# Patient Record
Sex: Male | Born: 1960
Health system: Southern US, Community
[De-identification: ages and names within clinical notes are randomized; demographics above are authoritative.]

## PROBLEM LIST (undated history)

## (undated) HISTORY — PX: CARPAL TUNNEL RELEASE: SHX101

---

## 2005-04-15 ENCOUNTER — Other Ambulatory Visit: Payer: Self-pay

## 2005-04-29 ENCOUNTER — Ambulatory Visit: Payer: Self-pay | Admitting: Orthopaedic Surgery

## 2005-05-31 ENCOUNTER — Emergency Department: Payer: Self-pay | Admitting: Emergency Medicine

## 2006-03-10 ENCOUNTER — Ambulatory Visit: Payer: Self-pay | Admitting: Orthopaedic Surgery

## 2006-12-26 ENCOUNTER — Ambulatory Visit: Payer: Self-pay | Admitting: Internal Medicine

## 2007-08-13 ENCOUNTER — Ambulatory Visit: Payer: Self-pay | Admitting: Internal Medicine

## 2012-04-16 ENCOUNTER — Ambulatory Visit: Payer: Self-pay | Admitting: General Practice

## 2014-11-28 NOTE — Op Note (Signed)
PATIENT NAME:  Edward Frederick, Edward Frederick MR#:  [id number] DATE OF BIRTH:  07/02/1961  DATE OF PROCEDURE:  04/16/2012  PREOPERATIVE DIAGNOSES:  1. Left carpal tunnel syndrome.  2. Left stenosing tenosynovitis of the thumb.   POSTOPERATIVE DIAGNOSES: 1. Left carpal tunnel syndrome.  2. Left stenosing tenosynovitis of the thumb.      PROCEDURES PERFORMED:  1. Left carpal tunnel release. 2. Left trigger thumb release.   SURGEON: Laurice Record. Hooten, MD.   ANESTHESIA: General.   ESTIMATED BLOOD LOSS: Minimal.  TOURNIQUET TIME: 39 minutes.  DRAINS: None.   INDICATIONS FOR SURGERY: The patient is a 54 year old male who has been seen for complaints of numbness and paresthesias to the left hand as well as triggering and pain in the area of the A1 pulley of the left thumb. After a discussion of the risks and benefits of surgical intervention, the patient expressed his understanding of the risks and benefits and agreed with plans for surgical intervention.   PROCEDURE IN DETAIL: The patient was brought to the Operating Room and, after adequate general anesthesia was achieved, a tourniquet was placed on the patient's upper left arm. The patient's left hand and arm were cleaned and prepped with alcohol and DuraPrep and draped in the usual sterile fashion. A "time-out" was performed as per usual protocol. The left upper extremity was exsanguinated using an Esmarch, and the tourniquet was inflated to 250 mmHg. Loupe magnification was used throughout the procedure. A curvilinear incision was made just ulnar to the thenar palmar crease. Dissection was carried down through the palmar fascia to the transverse carpal ligament. The transverse carpal ligament was sharply incised, taking care to protect the underlying structures within the carpal tunnel. Complete release of the transverse carpal ligament was achieved. Initial evaluation of the median nerve showed it to be flattened under the extent of the transverse carpal  ligament. However, there was restoration of more normal shape with visible vascularity noted along its surface. The wound was irrigated with copious amounts of normal saline with antibiotic solution. Skin edges were reapproximated using interrupted sutures of 5-0 nylon.   Next, a transverse incision was made at the base of the thumb at the proximal extent of the A1 pulley. Careful dissection was carried down to the tendon sheath and the leading edge of the A1 pulley of the left thumb was visualized and sharply incised using tenotomy scissors. Complete release of the A1 pulley was appreciated. Passive range of motion of the thumb demonstrated smooth excursion of the tendon. The wound was irrigated with normal saline with antibiotic solution. The skin edges were reapproximated using 5-0 nylon. 0.25% Marcaine was used to inject along the incision sites. A sterile dressing was applied followed by application of a volar splint. Tourniquet was deflated after a total tourniquet time of 39 minutes. The patient tolerated the procedure well. He was transported to the recovery room in stable condition.    ____________________________ Laurice Record. Holley Bouche., MD jph:ap D: 04/16/2012 12:54:44 ET T: 04/16/2012 17:26:58 ET JOB#: [id number]  cc: Laurice Record. Holley Bouche., MD, <Dictator> JAMES P Holley Bouche MD ELECTRONICALLY SIGNED 04/18/2012 13:13

## 2015-08-23 ENCOUNTER — Ambulatory Visit: Payer: Self-pay | Admitting: Physician Assistant

## 2015-08-23 ENCOUNTER — Encounter: Payer: Self-pay | Admitting: Physician Assistant

## 2015-08-23 VITALS — BP 158/90 | HR 92 | Temp 97.9°F

## 2015-08-23 DIAGNOSIS — J069 Acute upper respiratory infection, unspecified: Secondary | ICD-10-CM

## 2015-08-23 MED ORDER — AZITHROMYCIN 250 MG PO TABS: ORAL_TABLET | ORAL | Status: DC

## 2015-08-23 MED ORDER — FLUTICASONE PROPIONATE 50 MCG/ACT NA SUSP: 2.0000 | Freq: Every day | NASAL | Status: DC

## 2015-08-23 NOTE — Progress Notes (Signed)
S: C/o runny nose and congestion for 3 days, no fever, chills, cp/sob, v/d; mucus is clear/yellow and thick, cough is sporadic, c/o of facial and dental pain.   Using otc meds: mucinex d  O: PE: vitals w slightly elevated bp and pulse (pt states he ran over here) nad,  perrl eomi, normocephalic, tms dull, nasal mucosa red and swollen, throat injected, neck supple no lymph, lungs c t a, cv rrr, neuro intact  A:  Acute sinusitis   P: zpack, flonase, monitor pulse and bp, if increasing on mucinex d then stop the medication;  drink fluids, continue regular meds , use otc meds of choice, return if not improving in 5 days, return earlier if worsening

## 2015-09-05 DIAGNOSIS — Z0000 Encounter for general adult medical examination without abnormal findings: Secondary | ICD-10-CM | POA: Diagnosis not present

## 2015-09-05 DIAGNOSIS — N400 Enlarged prostate without lower urinary tract symptoms: Secondary | ICD-10-CM | POA: Diagnosis not present

## 2015-09-05 DIAGNOSIS — Z125 Encounter for screening for malignant neoplasm of prostate: Secondary | ICD-10-CM | POA: Diagnosis not present

## 2015-09-05 DIAGNOSIS — E291 Testicular hypofunction: Secondary | ICD-10-CM | POA: Diagnosis not present

## 2015-09-05 DIAGNOSIS — R030 Elevated blood-pressure reading, without diagnosis of hypertension: Secondary | ICD-10-CM | POA: Diagnosis not present

## 2016-02-25 ENCOUNTER — Ambulatory Visit: Payer: Self-pay | Admitting: Physician Assistant

## 2016-02-25 ENCOUNTER — Encounter: Payer: Self-pay | Admitting: Physician Assistant

## 2016-02-25 VITALS — BP 138/90 | HR 80 | Temp 97.6°F

## 2016-02-25 DIAGNOSIS — J069 Acute upper respiratory infection, unspecified: Secondary | ICD-10-CM

## 2016-02-25 MED ORDER — AZITHROMYCIN 250 MG PO TABS: ORAL_TABLET | ORAL | Status: DC

## 2016-02-25 NOTE — Progress Notes (Signed)
S:  Facial pressure, dry cough and yellow mucus for 3 weeks.  Not aware of fever.  OTC robitussin not helping.  Non- smoker O: TMS dull bilat, nose boggy, throat with mod injection and drainage, neck supple without aden.  Lungs clear with congested cough.  Heart RRR. A: sinusitis/ acute bronchitis P: z pack    If not improving in one week then CXR

## 2016-08-31 ENCOUNTER — Ambulatory Visit
Admission: EM | Admit: 2016-08-31 | Discharge: 2016-08-31 | Disposition: A | Discharge status: Home / Self Care | Payer: 59 | Attending: Family Medicine | Admitting: Family Medicine

## 2016-08-31 DIAGNOSIS — J069 Acute upper respiratory infection, unspecified: Secondary | ICD-10-CM

## 2016-08-31 MED ORDER — AMOXICILLIN-POT CLAVULANATE 875-125 MG PO TABS: 1.0000 | ORAL_TABLET | Freq: Two times a day (BID) | ORAL | 0 refills | Status: DC

## 2016-08-31 NOTE — ED Provider Notes (Signed)
[facility]    CSN: [id number] Arrival date & time: 08/31/16  0800  History   Chief Complaint Chief Complaint  Patient presents with  . Sinus Problem   HPI  56 year old male with hypogonadism, and BPH presents with complaints of sinus pain and pressure.  Patient states that he's been sick since Friday. He's had severe sinus pain and pressure. Ear pain as well. Mild cough. No fever or chills. Symptoms are severe per his report. He's been using Mucinex D without improvement. No known exacerbating factors. His primary concern is transmitting his illness to his wife as she is undergoing chemotherapy. No other associated symptoms. No other complaints or concerns at this time.  PMH - BPH, Low T  Past Surgical History:  Procedure Laterality Date  . CARPAL TUNNEL RELEASE      Home Medications    Prior to Admission medications   Medication Sig Start Date End Date Taking? Authorizing Provider  testosterone (ANDROGEL) 50 MG/5GM (1%) GEL Place 5 g onto the skin daily.   Yes Historical Provider, MD  amoxicillin-clavulanate (AUGMENTIN) 875-125 MG tablet Take 1 tablet by mouth every 12 (twelve) hours. 08/31/16   Coral Spikes, DO  azithromycin (ZITHROMAX Z-PAK) 250 MG tablet 2 pills today then 1 pill a day for 4 days 02/25/16   Johnn Hai, PA-C  fluticasone [facility]) 50 MCG/ACT nasal spray Place 2 sprays into both nostrils daily. 08/23/15   Versie Starks, PA-C    Family History History reviewed. No pertinent family history. (related to his acute compliant)  Social History Social History  Substance Use Topics  . Smoking status: Never Smoker  . Smokeless tobacco: Never Used  . Alcohol use No   Allergies   Patient has no known allergies.  Review of Systems Review of Systems  Constitutional: Negative for fever.  HENT: Positive for ear pain, postnasal drip, sinus pain and sinus pressure.   Respiratory: Positive for cough.   All other systems reviewed and are  negative.  Physical Exam Triage Vital Signs ED Triage Vitals  Enc Vitals Group     BP 08/31/16 0817 130/81     Pulse Rate 08/31/16 0817 74     Resp 08/31/16 0817 16     Temp 08/31/16 0817 97.8 F (36.6 C)     Temp Source 08/31/16 0817 Oral     SpO2 08/31/16 0817 100 %     Weight 08/31/16 0814 220 lb (99.8 kg)     Height 08/31/16 0814 5\' 8"  (1.727 m)     Head Circumference --      Peak Flow --      Pain Score 08/31/16 0815 6     Pain Loc --      Pain Edu? --      Excl. in [place]? --    Updated Vital Signs BP 130/81 (BP Location: Left Arm)   Pulse 74   Temp 97.8 F (36.6 C) (Oral)   Resp 16   Ht 5\' 8"  (1.727 m)   Wt 220 lb (99.8 kg)   SpO2 100%   BMI 33.45 kg/m   Physical Exam  Constitutional: He is oriented to person, place, and time. He appears well-developed. No distress.  HENT:  Head: Normocephalic and atraumatic.  Left TM obscured by cerumen. Right TM partially obscured by cerumen. Portion that was observed on the right appeared normal. Mild maxillary sinus tenderness to palpation.  Eyes: Conjunctivae are normal. Pupils are equal, round, and reactive to light.  Neck: Neck supple.  Cardiovascular: Normal rate and regular rhythm.   Pulmonary/Chest: Effort normal and breath sounds normal.  Abdominal: Soft. He exhibits no distension. There is no tenderness.  Musculoskeletal: Normal range of motion.  Neurological: He is alert and oriented to person, place, and time.  Skin: No rash noted.  Psychiatric: He has a normal mood and affect.  Vitals reviewed.  UC Treatments / Results  Labs (all labs ordered are listed, but only abnormal results are displayed) Labs Reviewed - No data to display  EKG  EKG Interpretation None      Radiology No results found.  Procedures Procedures (including critical care time)  Medications Ordered in UC Medications - No data to display   Initial Impression / Assessment and Plan / UC Course  I have reviewed the triage vital  signs and the nursing notes.  Pertinent labs & imaging results that were available during my care of the patient were reviewed by me and considered in my medical decision making (see chart for details).  57 year old male presents with upper respiratory complaints. Likely viral in nature. He is concerned about sinusitis and transmission to his wife. Will encourage over-the-counter medications - antihistamine, Flonase. If he fails to improve he can fill the antibiotic that is given (to cover for sinusitis).   Final Clinical Impressions(s) / UC Diagnoses   Final diagnoses:  Upper respiratory tract infection, unspecified type    New Prescriptions New Prescriptions   AMOXICILLIN-CLAVULANATE (AUGMENTIN) 875-125 MG TABLET    Take 1 tablet by mouth every 12 (twelve) hours.     Coral Spikes, DO 08/31/16 [phone]

## 2016-08-31 NOTE — Discharge Instructions (Signed)
This is likely viral.  Flonase and an OTC antihistamine. Rest, fluids.  If you fail to improve, you can fill the antibiotic.  Take care  Dr. Lacinda Axon

## 2016-08-31 NOTE — ED Triage Notes (Signed)
Patient complains of sinus pain and pressure. Patient states that this morning when he woke up he had an left ear ache. Patient reports that initial onset of symptoms started 4 days ago and have been worsening. Patient states that he has failed multiple OTC medications.

## 2016-09-03 DIAGNOSIS — H6123 Impacted cerumen, bilateral: Secondary | ICD-10-CM | POA: Diagnosis not present

## 2016-09-03 DIAGNOSIS — H60332 Swimmer's ear, left ear: Secondary | ICD-10-CM | POA: Diagnosis not present

## 2016-09-05 DIAGNOSIS — Z125 Encounter for screening for malignant neoplasm of prostate: Secondary | ICD-10-CM | POA: Diagnosis not present

## 2016-09-05 DIAGNOSIS — Z0000 Encounter for general adult medical examination without abnormal findings: Secondary | ICD-10-CM | POA: Diagnosis not present

## 2016-09-05 DIAGNOSIS — E291 Testicular hypofunction: Secondary | ICD-10-CM | POA: Diagnosis not present

## 2016-09-05 DIAGNOSIS — N400 Enlarged prostate without lower urinary tract symptoms: Secondary | ICD-10-CM | POA: Diagnosis not present

## 2016-09-23 ENCOUNTER — Ambulatory Visit: Payer: Self-pay | Admitting: Physician Assistant

## 2016-09-23 ENCOUNTER — Encounter: Payer: Self-pay | Admitting: Physician Assistant

## 2016-09-23 VITALS — BP 130/84 | HR 92 | Temp 98.0°F

## 2016-09-23 DIAGNOSIS — J069 Acute upper respiratory infection, unspecified: Secondary | ICD-10-CM

## 2016-09-23 LAB — POCT INFLUENZA A/B
Influenza A, POC: NEGATIVE
Influenza B, POC: NEGATIVE

## 2016-09-23 MED ORDER — HYDROCOD POLST-CPM POLST ER 10-8 MG/5ML PO SUER: 5.0000 mL | Freq: Two times a day (BID) | ORAL | 0 refills | Status: DC | PRN

## 2016-09-23 MED ORDER — LEVOFLOXACIN 500 MG PO TABS: 500.0000 mg | ORAL_TABLET | Freq: Every day | ORAL | 0 refills | Status: DC

## 2016-09-23 NOTE — Progress Notes (Signed)
S: C/o runny nose and congestion with dry cough for 14 days, + fever, chills, this weekend; some body aches; was on augmentin starting on 08-31-16, felt a little better but never felt like he got well;  denies cp/sob, v/d; mucus was green this am but clear throughout the day, cough is sporadic, wife has same sx, been passing it back and forth Using otc meds: r  O: PE: vitals wnl, nad,  perrl eomi, normocephalic, tms dull, nasal mucosa red and swollen, throat injected, neck supple no lymph, lungs c t a, cv rrr, neuro intact, cough is deep and hacking, flu swab neg  A:  Acute uri   P: levaquin, tussionex 181ml nr, 56ml q12h prn cough; drink fluids, continue regular meds , use otc meds of choice, return if not improving in 5 days, return earlier if worsening , rx for wife [employer] for tamiflu preventive,

## 2017-06-04 DIAGNOSIS — C4492 Squamous cell carcinoma of skin, unspecified: Secondary | ICD-10-CM

## 2017-06-04 DIAGNOSIS — L820 Inflamed seborrheic keratosis: Secondary | ICD-10-CM | POA: Diagnosis not present

## 2017-06-04 DIAGNOSIS — D2230 Melanocytic nevi of unspecified part of face: Secondary | ICD-10-CM | POA: Diagnosis not present

## 2017-06-04 DIAGNOSIS — D485 Neoplasm of uncertain behavior of skin: Secondary | ICD-10-CM | POA: Diagnosis not present

## 2017-06-04 DIAGNOSIS — D225 Melanocytic nevi of trunk: Secondary | ICD-10-CM | POA: Diagnosis not present

## 2017-06-04 DIAGNOSIS — L812 Freckles: Secondary | ICD-10-CM | POA: Diagnosis not present

## 2017-06-04 DIAGNOSIS — D0439 Carcinoma in situ of skin of other parts of face: Secondary | ICD-10-CM | POA: Diagnosis not present

## 2017-06-04 DIAGNOSIS — D229 Melanocytic nevi, unspecified: Secondary | ICD-10-CM | POA: Diagnosis not present

## 2017-06-04 DIAGNOSIS — L570 Actinic keratosis: Secondary | ICD-10-CM | POA: Diagnosis not present

## 2017-06-04 DIAGNOSIS — L219 Seborrheic dermatitis, unspecified: Secondary | ICD-10-CM | POA: Diagnosis not present

## 2017-06-04 DIAGNOSIS — L821 Other seborrheic keratosis: Secondary | ICD-10-CM | POA: Diagnosis not present

## 2017-06-04 HISTORY — DX: Squamous cell carcinoma of skin, unspecified: C44.92

## 2017-07-01 DIAGNOSIS — L578 Other skin changes due to chronic exposure to nonionizing radiation: Secondary | ICD-10-CM | POA: Diagnosis not present

## 2017-07-01 DIAGNOSIS — L988 Other specified disorders of the skin and subcutaneous tissue: Secondary | ICD-10-CM | POA: Diagnosis not present

## 2017-07-01 DIAGNOSIS — L814 Other melanin hyperpigmentation: Secondary | ICD-10-CM | POA: Diagnosis not present

## 2017-07-01 DIAGNOSIS — D0439 Carcinoma in situ of skin of other parts of face: Secondary | ICD-10-CM | POA: Diagnosis not present

## 2017-08-13 DIAGNOSIS — L570 Actinic keratosis: Secondary | ICD-10-CM | POA: Diagnosis not present

## 2017-08-13 DIAGNOSIS — Z85828 Personal history of other malignant neoplasm of skin: Secondary | ICD-10-CM | POA: Diagnosis not present

## 2017-08-13 DIAGNOSIS — L110 Acquired keratosis follicularis: Secondary | ICD-10-CM | POA: Diagnosis not present

## 2017-08-13 DIAGNOSIS — D485 Neoplasm of uncertain behavior of skin: Secondary | ICD-10-CM | POA: Diagnosis not present

## 2017-08-24 ENCOUNTER — Ambulatory Visit (INDEPENDENT_AMBULATORY_CARE_PROVIDER_SITE_OTHER): Payer: Self-pay | Admitting: Emergency Medicine

## 2017-08-24 VITALS — BP 141/90 | HR 95 | Temp 98.2°F | Resp 16 | Wt 220.0 lb

## 2017-08-24 DIAGNOSIS — J019 Acute sinusitis, unspecified: Secondary | ICD-10-CM

## 2017-08-24 MED ORDER — AMOXICILLIN-POT CLAVULANATE 875-125 MG PO TABS: 1.0000 | ORAL_TABLET | Freq: Two times a day (BID) | ORAL | 0 refills | Status: DC

## 2017-08-24 MED ORDER — PREDNISONE 20 MG PO TABS
20.0000 mg | ORAL_TABLET | Freq: Every day | ORAL | 0 refills | Status: DC
Start: 2017-08-24 — End: 2019-10-31

## 2017-08-24 NOTE — Progress Notes (Signed)
Subjective:     SAM OVERBECK is a 57 y.o. male who presents for evaluation of sinus pain. Symptoms include: congestion, facial pain, fevers, foul rhinorrhea, nasal congestion, purulent rhinorrhea, sinus pressure and tooth pain. Onset of symptoms was 3 days ago. Symptoms have been unchanged since that time. Past history is significant for no history of pneumonia or bronchitis. Patient is a non-smoker.  The following portions of the patient's history were reviewed and updated as appropriate: allergies and current medications.  Review of Systems Pertinent items noted in HPI and remainder of comprehensive ROS otherwise negative.   Objective:    BP (!) 141/90   Pulse 95   Temp 98.2 F (36.8 C)   Resp 16   Wt 220 lb (99.8 kg)   SpO2 97%   BMI 33.45 kg/m  General appearance: alert, cooperative and appears stated age Head: Normocephalic, without obvious abnormality, atraumatic Eyes: negative Ears: normal TM's and external ear canals both ears Nose: green and mucoid discharge, sinus tenderness bilateral Throat: lips, mucosa, and tongue normal; teeth and gums normal Neck: no adenopathy Lungs: clear to auscultation bilaterally Heart: regular rate and rhythm Extremities: extremities normal, atraumatic, no cyanosis or edema Pulses: 2+ and symmetric    Assessment:    Acute bacterial sinusitis.    Plan:    Nasal saline sprays. Augmentin per medication orders. Follow up in 1 week or as needed. Rest, fluids, counseling provided on OTC medicines for symptom managament.

## 2017-08-24 NOTE — Patient Instructions (Signed)

## 2017-08-28 DIAGNOSIS — L570 Actinic keratosis: Secondary | ICD-10-CM | POA: Diagnosis not present

## 2017-10-09 DIAGNOSIS — Z0000 Encounter for general adult medical examination without abnormal findings: Secondary | ICD-10-CM | POA: Diagnosis not present

## 2017-10-09 DIAGNOSIS — I10 Essential (primary) hypertension: Secondary | ICD-10-CM | POA: Diagnosis not present

## 2017-10-09 DIAGNOSIS — E291 Testicular hypofunction: Secondary | ICD-10-CM | POA: Diagnosis not present

## 2017-10-09 DIAGNOSIS — Z125 Encounter for screening for malignant neoplasm of prostate: Secondary | ICD-10-CM | POA: Diagnosis not present

## 2017-12-31 DIAGNOSIS — Z85828 Personal history of other malignant neoplasm of skin: Secondary | ICD-10-CM | POA: Diagnosis not present

## 2017-12-31 DIAGNOSIS — D229 Melanocytic nevi, unspecified: Secondary | ICD-10-CM | POA: Diagnosis not present

## 2017-12-31 DIAGNOSIS — Z1283 Encounter for screening for malignant neoplasm of skin: Secondary | ICD-10-CM | POA: Diagnosis not present

## 2017-12-31 DIAGNOSIS — D1800 Hemangioma unspecified site: Secondary | ICD-10-CM | POA: Diagnosis not present

## 2017-12-31 DIAGNOSIS — B353 Tinea pedis: Secondary | ICD-10-CM | POA: Diagnosis not present

## 2017-12-31 DIAGNOSIS — L814 Other melanin hyperpigmentation: Secondary | ICD-10-CM | POA: Diagnosis not present

## 2017-12-31 DIAGNOSIS — L820 Inflamed seborrheic keratosis: Secondary | ICD-10-CM | POA: Diagnosis not present

## 2017-12-31 DIAGNOSIS — L570 Actinic keratosis: Secondary | ICD-10-CM | POA: Diagnosis not present

## 2017-12-31 DIAGNOSIS — L821 Other seborrheic keratosis: Secondary | ICD-10-CM | POA: Diagnosis not present

## 2018-02-25 ENCOUNTER — Encounter: Payer: Self-pay | Admitting: Family Medicine

## 2018-02-25 ENCOUNTER — Ambulatory Visit (INDEPENDENT_AMBULATORY_CARE_PROVIDER_SITE_OTHER): Payer: Self-pay | Admitting: Family Medicine

## 2018-02-25 VITALS — BP 140/82 | Temp 98.2°F | Wt 219.0 lb

## 2018-02-25 DIAGNOSIS — J019 Acute sinusitis, unspecified: Secondary | ICD-10-CM

## 2018-02-25 MED ORDER — AMOXICILLIN-POT CLAVULANATE 875-125 MG PO TABS: 1.0000 | ORAL_TABLET | Freq: Two times a day (BID) | ORAL | 0 refills | Status: DC

## 2018-02-25 MED ORDER — FLUTICASONE PROPIONATE 50 MCG/ACT NA SUSP: 2.0000 | Freq: Every day | NASAL | 6 refills | Status: DC

## 2018-02-25 NOTE — Patient Instructions (Addendum)
Continue Flonase nasal sprays 1 spray per nares daily as needed.  Start Augmentin 1 tablet twice daily with food to avoid stomach upset. Complete all medication.    Sinusitis, Adult Sinusitis is soreness and inflammation of your sinuses. Sinuses are hollow spaces in the bones around your face. They are located:  Around your eyes.  In the middle of your forehead.  Behind your nose.  In your cheekbones.  Your sinuses and nasal passages are lined with a stringy fluid (mucus). Mucus normally drains out of your sinuses. When your nasal tissues get inflamed or swollen, the mucus can get trapped or blocked so air cannot flow through your sinuses. This lets bacteria, viruses, and funguses grow, and that leads to infection. Follow these instructions at home: Medicines  Take, use, or apply over-the-counter and prescription medicines only as told by your doctor. These may include nasal sprays.  If you were prescribed an antibiotic medicine, take it as told by your doctor. Do not stop taking the antibiotic even if you start to feel better. Hydrate and Humidify  Drink enough water to keep your pee (urine) clear or pale yellow.  Use a cool mist humidifier to keep the humidity level in your home above 50%.  Breathe in steam for 10-15 minutes, 3-4 times a day or as told by your doctor. You can do this in the bathroom while a hot shower is running.  Try not to spend time in cool or dry air. Rest  Rest as much as possible.  Sleep with your head raised (elevated).  Make sure to get enough sleep each night. General instructions  Put a warm, moist washcloth on your face 3-4 times a day or as told by your doctor. This will help with discomfort.  Wash your hands often with soap and water. If there is no soap and water, use hand sanitizer.  Do not smoke. Avoid being around people who are smoking (secondhand smoke).  Keep all follow-up visits as told by your doctor. This is  important. Contact a doctor if:  You have a fever.  Your symptoms get worse.  Your symptoms do not get better within 10 days. Get help right away if:  You have a very bad headache.  You cannot stop throwing up (vomiting).  You have pain or swelling around your face or eyes.  You have trouble seeing.  You feel confused.  Your neck is stiff.  You have trouble breathing. This information is not intended to replace advice given to you by your health care provider. Make sure you discuss any questions you have with your health care provider. Document Released: 01/14/2008 Document Revised: 03/23/2016 Document Reviewed: 05/23/2015 Elsevier Interactive Patient Education  [employer].

## 2018-02-25 NOTE — Progress Notes (Signed)
Subjective:  Edward Frederick is a 57 y.o. male who presents for evaluation of possible sinusitis.  Symptoms include non-productive cough, nasal congestion, facial pressure,headache, and fatigue gradually worsening over the course of two weeks. Treatment to date:  decongestants, nasal spray, acetimophen, mucinex .  High risk factors for influenza complications:  none.  The following portions of the patient's history were reviewed and updated as appropriate:  allergies, current medications and past medical history.  Pertinent negatives listed in HPI  Objective:  Physical Exam  Constitutional: He is oriented to person, place, and time.  HENT:  Head: Normocephalic and atraumatic.  Right Ear: External ear normal.  Left Ear: External ear normal.  Nose: Mucosal edema and rhinorrhea present. Right sinus exhibits maxillary sinus tenderness and frontal sinus tenderness. Left sinus exhibits maxillary sinus tenderness and frontal sinus tenderness.  Mouth/Throat: Oropharynx is clear and moist.  Cardiovascular: Normal rate, regular rhythm, normal heart sounds and intact distal pulses.  Pulmonary/Chest: Effort normal and breath sounds normal.  Neurological: He is alert and oriented to person, place, and time.  Skin: Skin is warm and dry.   Assessment and Plan:  1. Acute sinusitis, recurrence not specified, unspecified location Discussed diagnosis and treatment of sinusitis. Advised to follow-up if symptoms worsen or do not completely resolve.   Meds ordered this encounter  Medications  . fluticasone (FLONASE) 50 MCG/ACT nasal spray    Sig: Place 2 sprays into both nostrils daily.    Dispense:  16 g    Refill:  6  . amoxicillin-clavulanate (AUGMENTIN) 875-125 MG tablet    Sig: Take 1 tablet by mouth 2 (two) times daily.    Dispense:  20 tablet    Refill:  0   Carroll Sage. Kenton Kingfisher, MSN, FNP-C [facility]  [address] [place], [place] [address] [phone]

## 2018-03-01 ENCOUNTER — Telehealth: Payer: Self-pay | Admitting: Emergency Medicine

## 2018-03-01 NOTE — Telephone Encounter (Signed)
Left message follow up call from visit with [facility]. 

## 2018-07-01 DIAGNOSIS — L812 Freckles: Secondary | ICD-10-CM | POA: Diagnosis not present

## 2018-07-01 DIAGNOSIS — D2230 Melanocytic nevi of unspecified part of face: Secondary | ICD-10-CM | POA: Diagnosis not present

## 2018-07-01 DIAGNOSIS — D229 Melanocytic nevi, unspecified: Secondary | ICD-10-CM | POA: Diagnosis not present

## 2018-07-01 DIAGNOSIS — L821 Other seborrheic keratosis: Secondary | ICD-10-CM | POA: Diagnosis not present

## 2018-07-01 DIAGNOSIS — C44219 Basal cell carcinoma of skin of left ear and external auricular canal: Secondary | ICD-10-CM | POA: Diagnosis not present

## 2018-07-01 DIAGNOSIS — C4491 Basal cell carcinoma of skin, unspecified: Secondary | ICD-10-CM

## 2018-07-01 DIAGNOSIS — Z85828 Personal history of other malignant neoplasm of skin: Secondary | ICD-10-CM | POA: Diagnosis not present

## 2018-07-01 DIAGNOSIS — L570 Actinic keratosis: Secondary | ICD-10-CM | POA: Diagnosis not present

## 2018-07-01 DIAGNOSIS — D485 Neoplasm of uncertain behavior of skin: Secondary | ICD-10-CM | POA: Diagnosis not present

## 2018-07-01 DIAGNOSIS — Z1283 Encounter for screening for malignant neoplasm of skin: Secondary | ICD-10-CM | POA: Diagnosis not present

## 2018-07-01 DIAGNOSIS — D225 Melanocytic nevi of trunk: Secondary | ICD-10-CM | POA: Diagnosis not present

## 2018-07-01 HISTORY — DX: Basal cell carcinoma of skin, unspecified: C44.91

## 2018-07-06 DIAGNOSIS — L570 Actinic keratosis: Secondary | ICD-10-CM | POA: Diagnosis not present

## 2018-07-30 DIAGNOSIS — C44219 Basal cell carcinoma of skin of left ear and external auricular canal: Secondary | ICD-10-CM | POA: Diagnosis not present

## 2018-09-02 DIAGNOSIS — L814 Other melanin hyperpigmentation: Secondary | ICD-10-CM | POA: Diagnosis not present

## 2018-09-02 DIAGNOSIS — L570 Actinic keratosis: Secondary | ICD-10-CM | POA: Diagnosis not present

## 2018-09-02 DIAGNOSIS — R21 Rash and other nonspecific skin eruption: Secondary | ICD-10-CM | POA: Diagnosis not present

## 2018-09-02 DIAGNOSIS — Z85828 Personal history of other malignant neoplasm of skin: Secondary | ICD-10-CM | POA: Diagnosis not present

## 2018-09-02 DIAGNOSIS — L820 Inflamed seborrheic keratosis: Secondary | ICD-10-CM | POA: Diagnosis not present

## 2018-12-07 DIAGNOSIS — Z0000 Encounter for general adult medical examination without abnormal findings: Secondary | ICD-10-CM | POA: Diagnosis not present

## 2018-12-07 DIAGNOSIS — Z125 Encounter for screening for malignant neoplasm of prostate: Secondary | ICD-10-CM | POA: Diagnosis not present

## 2018-12-07 DIAGNOSIS — E291 Testicular hypofunction: Secondary | ICD-10-CM | POA: Diagnosis not present

## 2018-12-07 DIAGNOSIS — I10 Essential (primary) hypertension: Secondary | ICD-10-CM | POA: Diagnosis not present

## 2018-12-07 DIAGNOSIS — D696 Thrombocytopenia, unspecified: Secondary | ICD-10-CM | POA: Diagnosis not present

## 2019-06-29 DIAGNOSIS — I10 Essential (primary) hypertension: Secondary | ICD-10-CM | POA: Diagnosis not present

## 2019-06-29 DIAGNOSIS — G8929 Other chronic pain: Secondary | ICD-10-CM | POA: Insufficient documentation

## 2019-06-29 DIAGNOSIS — M25511 Pain in right shoulder: Secondary | ICD-10-CM | POA: Diagnosis not present

## 2019-09-22 DIAGNOSIS — L219 Seborrheic dermatitis, unspecified: Secondary | ICD-10-CM | POA: Diagnosis not present

## 2019-09-22 DIAGNOSIS — L508 Other urticaria: Secondary | ICD-10-CM | POA: Diagnosis not present

## 2019-09-22 DIAGNOSIS — B353 Tinea pedis: Secondary | ICD-10-CM | POA: Diagnosis not present

## 2019-09-22 DIAGNOSIS — L570 Actinic keratosis: Secondary | ICD-10-CM | POA: Diagnosis not present

## 2019-09-22 DIAGNOSIS — L578 Other skin changes due to chronic exposure to nonionizing radiation: Secondary | ICD-10-CM | POA: Diagnosis not present

## 2019-09-22 DIAGNOSIS — Z1283 Encounter for screening for malignant neoplasm of skin: Secondary | ICD-10-CM | POA: Diagnosis not present

## 2019-09-22 DIAGNOSIS — D225 Melanocytic nevi of trunk: Secondary | ICD-10-CM | POA: Diagnosis not present

## 2019-09-22 DIAGNOSIS — D2272 Melanocytic nevi of left lower limb, including hip: Secondary | ICD-10-CM | POA: Diagnosis not present

## 2019-09-22 DIAGNOSIS — D229 Melanocytic nevi, unspecified: Secondary | ICD-10-CM | POA: Diagnosis not present

## 2019-10-28 ENCOUNTER — Other Ambulatory Visit: Payer: Self-pay

## 2019-10-28 ENCOUNTER — Ambulatory Visit (INDEPENDENT_AMBULATORY_CARE_PROVIDER_SITE_OTHER): Payer: 59

## 2019-10-28 DIAGNOSIS — L570 Actinic keratosis: Secondary | ICD-10-CM | POA: Diagnosis not present

## 2019-10-28 MED ORDER — AMINOLEVULINIC ACID HCL 20 % EX SOLR
1.0000 "application " | Freq: Once | CUTANEOUS | Status: AC
  Administered 2019-10-28: 354 mg via TOPICAL

## 2019-10-28 NOTE — Patient Instructions (Signed)

## 2019-10-28 NOTE — Progress Notes (Signed)
Patient completed PDT therapy today.  AK (actinic keratosis) Head - Anterior (Face)  Photodynamic therapy - Head - Anterior (Face) Procedure discussed: discussed risks, benefits, side effects. and alternatives   Prep: site scrubbed/prepped with acetone   Number of lesions:  Multiple Type of treatment:  Blue light Aminolevulinic Acid (see MAR for details): Levulan Number of Levulan sticks used:  1 Incubation time (minutes):  60 Number of minutes under lamp:  16 Number of seconds under lamp:  40 Cooling:  Floor fan Post-procedure details: sunscreen applied    Aminolevulinic Acid HCl 20 % SOLR 354 mg - Head - Anterior (Face)

## 2019-10-31 ENCOUNTER — Other Ambulatory Visit: Payer: Self-pay

## 2019-10-31 ENCOUNTER — Ambulatory Visit (INDEPENDENT_AMBULATORY_CARE_PROVIDER_SITE_OTHER): Payer: Self-pay | Admitting: Dermatology

## 2019-10-31 DIAGNOSIS — R21 Rash and other nonspecific skin eruption: Secondary | ICD-10-CM

## 2019-10-31 DIAGNOSIS — L568 Other specified acute skin changes due to ultraviolet radiation: Secondary | ICD-10-CM

## 2019-10-31 MED ORDER — HYDROCORTISONE 2.5 % EX OINT: TOPICAL_OINTMENT | Freq: Two times a day (BID) | CUTANEOUS | 1 refills | Status: DC

## 2019-10-31 NOTE — Progress Notes (Signed)
   Follow-Up Visit   Subjective  Edward Frederick is a 59 y.o. male who presents for the following: Blistering from PDT txt (PDT txt 10/28/19, blisters started friday afternoon, painful, pt has been putting vaseline on face, some bleeding this am, pt denies any hx of sun exposure after PDT txt).   The following portions of the chart were reviewed this encounter and updated as appropriate:     Review of Systems: No other skin or systemic complaints.  Objective  Well appearing patient in no apparent distress; mood and affect are within normal limits.  A focused examination was performed including face. Relevant physical exam findings are noted in the Assessment and Plan.  Objective  face: Marked erythema with diffuse vesiculation on cheeks, nose, forehead, temples, pt denies any additional sun exposure after PDT.  Images        Assessment & Plan  Acute phototoxic dermatitis  Rash face  Start Domeboros compresses 15-20 min qd/bid to face, avoid eye area, apply vaseline around eyes/eyelids to protect Cont Vaseline through out the day to aas face prn Cont to avoid sun exposure during healing process Once skin has healed, then use sunscreen daily and reapply as needed  hydrocortisone 2.5 % ointment - face  Return in about 4 days (around 11/04/2019) for With Dr. Laurence Ferrari for f/u from PDT.   I, Othelia Pulling, RMA, am acting as scribe for Brendolyn Patty, MD .

## 2019-11-04 ENCOUNTER — Ambulatory Visit (INDEPENDENT_AMBULATORY_CARE_PROVIDER_SITE_OTHER): Payer: 59 | Admitting: Dermatology

## 2019-11-04 ENCOUNTER — Other Ambulatory Visit: Payer: Self-pay

## 2019-11-04 DIAGNOSIS — R21 Rash and other nonspecific skin eruption: Secondary | ICD-10-CM

## 2019-11-04 DIAGNOSIS — G8918 Other acute postprocedural pain: Secondary | ICD-10-CM

## 2019-11-04 NOTE — Progress Notes (Signed)
   Follow-Up Visit   Subjective  Edward Frederick is a 59 y.o. male who presents for the following: f/u PDT treatment with severe reaction.   He notes continued improvement since his visit earlier in the week. He still has persistent redness. He is using domeboro soaks daily, hydrocortisone and vaseline.   The following portions of the chart were reviewed this encounter and updated as appropriate: Tobacco  Allergies  Meds  Problems  Med Hx  Surg Hx  Fam Hx      Review of Systems: No other skin or systemic complaints.  Objective  Well appearing patient in no apparent distress; mood and affect are within normal limits.  A focused examination was performed including head, including the scalp, face, neck, nose, ears, eyelids, and lips. Relevant physical exam findings are noted in the Assessment and Plan.  No skin findings found.  Assessment & Plan   Rash Significant erythema with resolving crusting and cheeks and temples. Significantly improved from prior.  Continue Hydrocortisone cream 2.5%to face Continue Cont Vaseline throughout the day to affected areas prn Cont to avoid sun exposure during healing process Once skin has healed, then use sunscreen daily and reapply as needed Continue Domeboros compresses 15-20 min qd/bid to face, avoid eye area  Will start Elta MD Laser Balm PRN Laser Enzyme Gel QD or BID if tolerated Foam facial cleaner QD UV Elements Tinted Sunscreen Daily  Patient given work note through next week and when he returns, restrictions to avoid high temperature environments and sun for another week.  Return as scheduled  I, Donzetta Kohut, CMA, am acting as scribe for Forest Gleason, MD .   Documentation: I have reviewed the above documentation for accuracy and completeness, and I agree with the above.  Forest Gleason, MD

## 2019-11-04 NOTE — Patient Instructions (Signed)
Continue Hydrocortisone cream2.5%to face  Continue Cont Vaseline through out the day to aas face prn  Cont to avoid sun exposure during healing process  Once skin has healed, then use sunscreen daily and reapply as needed  Continue Domeboros compresses 15-20 min qd/bid to face, avoid eye area  Will start Elta MD:  Laser Balm PRN Laser Enzyme Gel QD or BID if tolerated Foam facial cleaner QD UV Elements Tinted Sunscreen Daily

## 2019-11-09 ENCOUNTER — Encounter: Payer: Self-pay | Admitting: Dermatology

## 2019-11-17 ENCOUNTER — Other Ambulatory Visit: Payer: Self-pay

## 2019-11-17 ENCOUNTER — Ambulatory Visit (INDEPENDENT_AMBULATORY_CARE_PROVIDER_SITE_OTHER): Payer: 59 | Admitting: Dermatology

## 2019-11-17 DIAGNOSIS — L821 Other seborrheic keratosis: Secondary | ICD-10-CM | POA: Diagnosis not present

## 2019-11-17 DIAGNOSIS — Z872 Personal history of diseases of the skin and subcutaneous tissue: Secondary | ICD-10-CM | POA: Diagnosis not present

## 2019-11-17 DIAGNOSIS — B353 Tinea pedis: Secondary | ICD-10-CM | POA: Diagnosis not present

## 2019-11-17 DIAGNOSIS — D2272 Melanocytic nevi of left lower limb, including hip: Secondary | ICD-10-CM | POA: Diagnosis not present

## 2019-11-17 DIAGNOSIS — B351 Tinea unguium: Secondary | ICD-10-CM | POA: Diagnosis not present

## 2019-11-17 DIAGNOSIS — D229 Melanocytic nevi, unspecified: Secondary | ICD-10-CM

## 2019-11-17 DIAGNOSIS — L578 Other skin changes due to chronic exposure to nonionizing radiation: Secondary | ICD-10-CM | POA: Diagnosis not present

## 2019-11-17 DIAGNOSIS — I781 Nevus, non-neoplastic: Secondary | ICD-10-CM

## 2019-11-17 NOTE — Progress Notes (Addendum)
   Follow-Up Visit   Subjective  Edward Frederick is a 59 y.o. male who presents for the following: Follow-up .  AK follow up. Patient had LN2 on 09/22/2019. Had PDT on 10/28/19 to face with severe reaction. He also reports continued difficulty with his toenails and feet.  Since having PDT patient has noticed a spot on right cheek that has become more prominent. He was also seen for tinea pedis and given some ciclopirox which he is currently using but has not noticed much improvement.   The following portions of the chart were reviewed this encounter and updated as appropriate: Tobacco  Allergies  Meds  Problems  Med Hx  Surg Hx  Fam Hx      Review of Systems: No other skin or systemic complaints.  Objective  Well appearing patient in no apparent distress; mood and affect are within normal limits.  A focused examination was performed including feet, face, toenails. Relevant physical exam findings are noted in the Assessment and Plan.  Objective  Left Foot - medial: 0.3cm pink tan macule  Objective  Toenails: Toenails thickened with subungual debris  Objective  Feet: Scaling and maceration web spaces and over distal and lateral soles.   Assessment & Plan    Seborrheic Keratoses - Stuck-on, waxy, tan-brown papules and plaques including at right cheek - Discussed benign etiology and prognosis. - Observe - Call for any changes  Nevus Left Foot - medial  Benign, observe.    Onychomycosis Toenails  Pending labs start terbinafine 250mg  1 PO QD x 1 week. Repeat at 3 months, 5 months and 7 months. #28 0RF   Hepatic Function Panel - Toenails  Creatinine, Serum - Toenails  Other Related Medications terbinafine (LAMISIL) 250 MG tablet  Tinea pedis of right foot Feet  Planning to treat onychomycosis with terbinafine so it will treat this as well  History of actinic keratoses of the face - Clear since cryotherapy and PDT  Actinic Damage - diffuse scaly  erythematous macules with underlying dyspigmentation - Recommend daily broad spectrum sunscreen SPF 30+ to sun-exposed areas, reapply every 2 hours as needed.  - Call for new or changing lesions.  Telangiectasia - Dilated blood vessel at cheek - Benign appearing on exam - Call for changes  Return in about 6 months (around 05/18/2020) for TBSE.   Graciella Belton, RMA, am acting as scribe for Forest Gleason, MD .  Documentation: I have reviewed the above documentation for accuracy and completeness, and I agree with the above.  Forest Gleason, MD

## 2019-11-18 LAB — CREATININE, SERUM
Creatinine, Ser: 1.15 mg/dL (ref 0.76–1.27)
GFR calc Af Amer: 80 mL/min/{1.73_m2} (ref >59)
GFR calc non Af Amer: 69 mL/min/{1.73_m2} (ref >59)

## 2019-11-18 LAB — HEPATIC FUNCTION PANEL
ALT: 20 IU/L (ref 0–44)
AST: 17 IU/L (ref 0–40)
Albumin: 4.5 g/dL (ref 3.8–4.9)
Alkaline Phosphatase: 66 IU/L (ref 39–117)
Bilirubin Total: 0.7 mg/dL (ref 0.0–1.2)
Bilirubin, Direct: 0.19 mg/dL (ref 0.00–0.40)
Total Protein: 6.9 g/dL (ref 6.0–8.5)

## 2019-11-18 NOTE — Progress Notes (Signed)
Labs ok. Please send in terbinafine to start as per note and notify patient by phone or mychart. Thank you!

## 2019-11-22 ENCOUNTER — Telehealth: Payer: Self-pay

## 2019-11-22 DIAGNOSIS — B351 Tinea unguium: Secondary | ICD-10-CM

## 2019-11-22 MED ORDER — TERBINAFINE HCL 250 MG PO TABS: ORAL_TABLET | ORAL | 0 refills | Status: DC

## 2019-11-22 NOTE — Telephone Encounter (Signed)
Called patient and informed him that the larger bill was for the PDT treatment and was not for the visit with Dr. Nicole Kindred. Patient verbalized understanding.

## 2019-11-22 NOTE — Telephone Encounter (Signed)
Patient advised labs ok and can start terbinafine 250mg  1 PO QD x 1 week. Repeat at months 3, 5 and 7. Will send in to [facility].

## 2019-11-22 NOTE — Telephone Encounter (Signed)
-----   Message from Alfonso Patten, MD sent at 11/22/2019  3:18 PM EDT ----- Regarding: FW: bill for visit with Dr. Odette Fraction ladies, could one of you please call Mr. Ogan and let him know that I checked with billing and the larger bill was for the PDT treatment itself and a balance went to his deductible, not for the post procedure appointment with Dr. Nicole Kindred?  Thank you!  ----- Message ----- From: Harvel Ricks Sent: 11/21/2019   8:20 AM EDT To: Alfonso Patten, MD Subject: RE: bill for visit with Dr. Nicole Kindred            He didn't get a large bill for that visit with Dr. Nicole Kindred. She only charged her an office visit that day. I think the patient was talking about the 10/28/19 dos; the PDT and the J code charge. It was coded correctly and it looks like the insurance applied the balance towards the patient's deductible amount.  ----- Message ----- From: Alfonso Patten, MD Sent: 11/18/2019   1:07 PM EDT To: Harvel Ricks Subject: bill for visit with Dr. Shelly Bombard, This patient saw Dr. Nicole Kindred on a Monday a few weeks ago for an adverse reaction after PDT. He said he got a large bill for that visit, larger than he usually gets from visits with me (at which we have often done freezing and procedures). Would you  mind checking on this to be sure the billing is correct? I know I put it down as a post-op for my visit (he's my patient regularly, not Dr. Les Pou) when I saw him later that week just since it was due to a reaction to a procedure in the office. He's okay paying it if everything is correct, but I told him I would double check since the bill was higher than usual. Thank you!

## 2019-11-24 ENCOUNTER — Encounter: Payer: Self-pay | Admitting: Dermatology

## 2019-12-05 DIAGNOSIS — Z125 Encounter for screening for malignant neoplasm of prostate: Secondary | ICD-10-CM | POA: Diagnosis not present

## 2019-12-05 DIAGNOSIS — Z0000 Encounter for general adult medical examination without abnormal findings: Secondary | ICD-10-CM | POA: Diagnosis not present

## 2019-12-19 DIAGNOSIS — E291 Testicular hypofunction: Secondary | ICD-10-CM | POA: Diagnosis not present

## 2019-12-19 DIAGNOSIS — I10 Essential (primary) hypertension: Secondary | ICD-10-CM | POA: Diagnosis not present

## 2019-12-19 DIAGNOSIS — Z0000 Encounter for general adult medical examination without abnormal findings: Secondary | ICD-10-CM | POA: Diagnosis not present

## 2019-12-19 DIAGNOSIS — D696 Thrombocytopenia, unspecified: Secondary | ICD-10-CM | POA: Diagnosis not present

## 2020-05-18 ENCOUNTER — Other Ambulatory Visit: Payer: Self-pay | Admitting: Podiatry

## 2020-05-18 DIAGNOSIS — L03031 Cellulitis of right toe: Secondary | ICD-10-CM | POA: Diagnosis not present

## 2020-05-18 DIAGNOSIS — M79674 Pain in right toe(s): Secondary | ICD-10-CM | POA: Diagnosis not present

## 2020-05-18 DIAGNOSIS — M79675 Pain in left toe(s): Secondary | ICD-10-CM | POA: Diagnosis not present

## 2020-05-18 DIAGNOSIS — L600 Ingrowing nail: Secondary | ICD-10-CM | POA: Diagnosis not present

## 2020-05-31 ENCOUNTER — Ambulatory Visit (INDEPENDENT_AMBULATORY_CARE_PROVIDER_SITE_OTHER): Payer: 59 | Admitting: Dermatology

## 2020-05-31 ENCOUNTER — Other Ambulatory Visit: Payer: Self-pay

## 2020-05-31 ENCOUNTER — Other Ambulatory Visit: Payer: Self-pay | Admitting: Dermatology

## 2020-05-31 DIAGNOSIS — S91104A Unspecified open wound of right lesser toe(s) without damage to nail, initial encounter: Secondary | ICD-10-CM

## 2020-05-31 DIAGNOSIS — D229 Melanocytic nevi, unspecified: Secondary | ICD-10-CM | POA: Diagnosis not present

## 2020-05-31 DIAGNOSIS — L219 Seborrheic dermatitis, unspecified: Secondary | ICD-10-CM | POA: Diagnosis not present

## 2020-05-31 DIAGNOSIS — L814 Other melanin hyperpigmentation: Secondary | ICD-10-CM

## 2020-05-31 DIAGNOSIS — Z1283 Encounter for screening for malignant neoplasm of skin: Secondary | ICD-10-CM

## 2020-05-31 DIAGNOSIS — L820 Inflamed seborrheic keratosis: Secondary | ICD-10-CM | POA: Diagnosis not present

## 2020-05-31 DIAGNOSIS — D485 Neoplasm of uncertain behavior of skin: Secondary | ICD-10-CM

## 2020-05-31 DIAGNOSIS — D2372 Other benign neoplasm of skin of left lower limb, including hip: Secondary | ICD-10-CM | POA: Diagnosis not present

## 2020-05-31 DIAGNOSIS — T148XXA Other injury of unspecified body region, initial encounter: Secondary | ICD-10-CM | POA: Diagnosis not present

## 2020-05-31 DIAGNOSIS — R21 Rash and other nonspecific skin eruption: Secondary | ICD-10-CM

## 2020-05-31 DIAGNOSIS — Z86007 Personal history of in-situ neoplasm of skin: Secondary | ICD-10-CM

## 2020-05-31 DIAGNOSIS — L821 Other seborrheic keratosis: Secondary | ICD-10-CM

## 2020-05-31 DIAGNOSIS — L578 Other skin changes due to chronic exposure to nonionizing radiation: Secondary | ICD-10-CM

## 2020-05-31 DIAGNOSIS — D2271 Melanocytic nevi of right lower limb, including hip: Secondary | ICD-10-CM

## 2020-05-31 DIAGNOSIS — L919 Hypertrophic disorder of the skin, unspecified: Secondary | ICD-10-CM | POA: Diagnosis not present

## 2020-05-31 DIAGNOSIS — D1800 Hemangioma unspecified site: Secondary | ICD-10-CM

## 2020-05-31 DIAGNOSIS — Z85828 Personal history of other malignant neoplasm of skin: Secondary | ICD-10-CM

## 2020-05-31 DIAGNOSIS — D489 Neoplasm of uncertain behavior, unspecified: Secondary | ICD-10-CM

## 2020-05-31 DIAGNOSIS — D239 Other benign neoplasm of skin, unspecified: Secondary | ICD-10-CM

## 2020-05-31 MED ORDER — HYDROCORTISONE 2.5 % EX OINT: TOPICAL_OINTMENT | Freq: Two times a day (BID) | CUTANEOUS | 1 refills | Status: DC

## 2020-05-31 NOTE — Patient Instructions (Addendum)
Recommend daily broad spectrum sunscreen SPF 30+ to sun-exposed areas, reapply every 2 hours as needed. Call for new or changing lesions.  Recommend Nicotinamide 500mg  twice per day to lower risk of non-melanoma skin cancer by approximately 25%.   Recommend taking Heliocare sun protection supplement daily in sunny weather for additional sun protection. For maximum protection on the sunniest days, you can take up to 2 capsules of regular Heliocare OR take 1 capsule of Heliocare Ultra. For prolonged exposure (such as a full day in the sun), you can repeat your dose of the supplement 4 hours after your first dose. Heliocare can be purchased at [facility] or at [email or web address].   Topical steroids (such as triamcinolone, fluocinolone, fluocinonide, mometasone, clobetasol, halobetasol, betamethasone, hydrocortisone) can cause thinning and lightening of the skin if they are used for too long in the same area. Your physician has selected the right strength medicine for your problem and area affected on the body. Please use your medication only as directed by your physician to prevent side effects.

## 2020-05-31 NOTE — Progress Notes (Addendum)
Follow-Up Visit   Subjective  Edward Frederick is a 59 y.o. male who presents for the following: No chief complaint on file..  Patient here for full body skin exam and skin cancer screening. Patient has an area of concern on his nose that is rough. Patient has h/o BCC L post auricular sulcus 06/22/18, SCCis right temple 06/04/17, AK with inverted follicular keratosis L. Temple 08/13/17  The following portions of the chart were reviewed this encounter and updated as appropriate:  Tobacco  Allergies  Meds  Problems  Med Hx  Surg Hx  Fam Hx      Review of Systems:  No other skin or systemic complaints except as noted in HPI or Assessment and Plan.  Objective  Well appearing patient in no apparent distress; mood and affect are within normal limits.  A full examination was performed including scalp, head, eyes, ears, nose, lips, neck, chest, axillae, abdomen, back, buttocks, bilateral upper extremities, bilateral lower extremities, hands, feet, fingers, toes, fingernails, and toenails. All findings within normal limits unless otherwise noted below.  Objective  Left Ear, Right Nasal Sidewall: Pink patches with greasy scale.   Objective  Right Hallux Toe Nail Plate: Open wound  Objective  Left Ankle - Anterior: Firm pink/brown papulenodule with dimple sign.   Objective  Right Dorsum of Foot: 0.15 cm brown macule  Objective  Right Axilla: 0.6 cm  erythematous fleshy papule   Assessment & Plan  Seborrheic dermatitis (2) Left Ear; Right Nasal Sidewall  Chronic, flared  Start HC 2.5% cream once to twice daily as needed for itch for up to 1 week. If not resolved, call and we will start ketoconazole cream and could start triamcinolone 0.1% cream for the ear.   Topical steroids (such as triamcinolone, fluocinolone, fluocinonide, mometasone, clobetasol, halobetasol, betamethasone, hydrocortisone) can cause thinning and lightening of the skin if they are used for too long in the  same area. Your physician has selected the right strength medicine for your problem and area affected on the body. Please use your medication only as directed by your physician to prevent side effects.    Rash  Reordered Medications hydrocortisone 2.5 % ointment  Open wound Right Hallux Toe Nail Plate  S/p surgery with podiatry Still tender and exudative today despite 1 week of p.o. antibiotic therapy and Neosporin use.  No itch, just tenderness.  Concern for possible infection. Will wound culture today.  Patient following up with podiatry tomorrow so will defer to them for immediate management.    Anaerobic and Aerobic Culture - Right Hallux Toe Nail Plate  Dermatofibroma Left Ankle - Anterior  Benign-appearing.  Observation.  Call clinic for new or changing lesions.  Recommend daily use of broad spectrum spf 30+ sunscreen to sun-exposed areas.     Nevus Right Dorsum of Foot  Benign-appearing.  Observation.  Call clinic for new or changing lesions.  Recommend daily use of broad spectrum spf 30+ sunscreen to sun-exposed areas.    Neoplasm of uncertain behavior Right Axilla  Epidermal / dermal shaving  Lesion diameter (cm):  0.5 Informed consent: discussed and consent obtained   Anesthesia: the lesion was anesthetized in a standard fashion   Anesthetic:  1% lidocaine w/ epinephrine 1-100,000 buffered w/ 8.4% NaHCO3 Instrument used: scissors   Hemostasis achieved with: pressure, aluminum chloride and electrodesiccation   Outcome: patient tolerated procedure well   Post-procedure details: wound care instructions given   Post-procedure details comment:  Ointment and bandage applied  Specimen 1 -  Surgical pathology Differential Diagnosis: Irritated acrochordon vs neurofibroma Check Margins: No 0.6 cm  erythematous fleshy papule  Snip removal today  Inflamed seborrheic keratosis (4) Left  Cheek x 2 (2); Right  Mandible; Left Upper Neck  Cryotherapy today Prior to  procedure, discussed risks of blister formation, small wound, skin dyspigmentation, or rare scar following cryotherapy.    Destruction of lesion - Left  Cheek x 2, Left Upper Neck, Right  Mandible  Destruction method: cryotherapy   Informed consent: discussed and consent obtained   Lesion destroyed using liquid nitrogen: Yes   Outcome: patient tolerated procedure well with no complications   Post-procedure details: wound care instructions given     Lentigines - Scattered tan macules - Discussed due to sun exposure - Benign, observe - Call for any changes  Seborrheic Keratoses - Stuck-on, waxy, tan-brown papules and plaques  - Discussed benign etiology and prognosis. - Observe - Call for any changes  Melanocytic Nevi - Tan-brown and/or pink-flesh-colored symmetric macules and papules - Benign appearing on exam today - Observation - Call clinic for new or changing moles - Recommend daily use of broad spectrum spf 30+ sunscreen to sun-exposed areas.   Hemangiomas - Red papules - Discussed benign nature - Observe - Call for any changes  Actinic Damage - diffuse scaly erythematous macules with underlying dyspigmentation - Recommend daily broad spectrum sunscreen SPF 30+ to sun-exposed areas, reapply every 2 hours as needed.  - Call for new or changing lesions.  Skin cancer screening performed today.  History of Basal Cell Carcinoma of the Skin - No evidence of recurrence today - Recommend regular full body skin exams - Recommend daily broad spectrum sunscreen SPF 30+ to sun-exposed areas, reapply every 2 hours as needed.  - Call if any new or changing lesions are noted between office visits  History of Squamous Cell Carcinoma in Situ of the Skin - No evidence of recurrence today - Recommend regular full body skin exams - Recommend daily broad spectrum sunscreen SPF 30+ to sun-exposed areas, reapply every 2 hours as needed.  - Call if any new or changing lesions are  noted between office visits   Return in about 2 years (around 05/31/2022) for Toe and nose.  IDonzetta Kohut, CMA, am acting as scribe for Forest Gleason, MD .  Documentation: I have reviewed the above documentation for accuracy and completeness, and I agree with the above.  Forest Gleason, MD

## 2020-06-01 ENCOUNTER — Encounter: Payer: Self-pay | Admitting: Dermatology

## 2020-06-01 DIAGNOSIS — L03031 Cellulitis of right toe: Secondary | ICD-10-CM | POA: Diagnosis not present

## 2020-06-01 DIAGNOSIS — M79674 Pain in right toe(s): Secondary | ICD-10-CM | POA: Diagnosis not present

## 2020-06-01 DIAGNOSIS — M79675 Pain in left toe(s): Secondary | ICD-10-CM | POA: Diagnosis not present

## 2020-06-01 DIAGNOSIS — L600 Ingrowing nail: Secondary | ICD-10-CM | POA: Diagnosis not present

## 2020-06-06 NOTE — Progress Notes (Signed)
Skin , right axilla FIBROEPITHELIAL POLYP "skin tag" --> No additional treatment needed

## 2020-06-07 LAB — ANAEROBIC AND AEROBIC CULTURE

## 2020-06-11 ENCOUNTER — Telehealth: Payer: Self-pay

## 2020-06-11 NOTE — Telephone Encounter (Signed)
Patient informed of pathology results 

## 2020-06-12 NOTE — Progress Notes (Signed)
Culture at right toe nail fold showed heavy growth of Staph aureus bacteria (that should have been sensitive to the cephalexin he was taking).   MAs please call and see how he is doing. If he is still having some tenderness, would recommend starting Mupirocin topical antibiotic ointment and following up with podiatry. If he has spreading redness, worsening pain, increase in drainage, fever or chills he should seek medical care. If he is doing well, would just monitor given he has been on appropriate therapy.

## 2020-06-15 ENCOUNTER — Other Ambulatory Visit: Payer: Self-pay | Admitting: Podiatry

## 2020-06-15 DIAGNOSIS — L600 Ingrowing nail: Secondary | ICD-10-CM | POA: Diagnosis not present

## 2020-06-15 DIAGNOSIS — M79674 Pain in right toe(s): Secondary | ICD-10-CM | POA: Diagnosis not present

## 2020-06-15 DIAGNOSIS — L03031 Cellulitis of right toe: Secondary | ICD-10-CM | POA: Diagnosis not present

## 2020-06-15 DIAGNOSIS — M79675 Pain in left toe(s): Secondary | ICD-10-CM | POA: Diagnosis not present

## 2020-07-13 ENCOUNTER — Other Ambulatory Visit: Payer: Self-pay | Admitting: Family Medicine

## 2020-07-19 DIAGNOSIS — E291 Testicular hypofunction: Secondary | ICD-10-CM | POA: Diagnosis not present

## 2020-07-19 DIAGNOSIS — D696 Thrombocytopenia, unspecified: Secondary | ICD-10-CM | POA: Diagnosis not present

## 2020-07-19 DIAGNOSIS — Z125 Encounter for screening for malignant neoplasm of prostate: Secondary | ICD-10-CM | POA: Diagnosis not present

## 2020-07-19 DIAGNOSIS — I10 Essential (primary) hypertension: Secondary | ICD-10-CM | POA: Diagnosis not present

## 2020-07-20 ENCOUNTER — Other Ambulatory Visit: Payer: Self-pay | Admitting: Podiatry

## 2020-07-23 DIAGNOSIS — L600 Ingrowing nail: Secondary | ICD-10-CM | POA: Diagnosis not present

## 2020-07-23 DIAGNOSIS — M79675 Pain in left toe(s): Secondary | ICD-10-CM | POA: Diagnosis not present

## 2020-07-23 DIAGNOSIS — M79674 Pain in right toe(s): Secondary | ICD-10-CM | POA: Diagnosis not present

## 2020-07-27 ENCOUNTER — Other Ambulatory Visit: Payer: Self-pay | Admitting: Family Medicine

## 2020-08-06 DIAGNOSIS — M79674 Pain in right toe(s): Secondary | ICD-10-CM | POA: Diagnosis not present

## 2020-08-06 DIAGNOSIS — M79675 Pain in left toe(s): Secondary | ICD-10-CM | POA: Diagnosis not present

## 2020-08-06 DIAGNOSIS — L600 Ingrowing nail: Secondary | ICD-10-CM | POA: Diagnosis not present

## 2020-08-09 ENCOUNTER — Ambulatory Visit: Payer: 59 | Admitting: Dermatology

## 2020-08-21 ENCOUNTER — Ambulatory Visit: Payer: 59 | Admitting: Dermatology

## 2020-08-23 DIAGNOSIS — J0100 Acute maxillary sinusitis, unspecified: Secondary | ICD-10-CM | POA: Diagnosis not present

## 2020-08-23 DIAGNOSIS — R059 Cough, unspecified: Secondary | ICD-10-CM | POA: Diagnosis not present

## 2020-08-27 ENCOUNTER — Telehealth: Payer: 59 | Admitting: Physician Assistant

## 2020-08-27 DIAGNOSIS — R059 Cough, unspecified: Secondary | ICD-10-CM

## 2020-08-27 DIAGNOSIS — U071 COVID-19: Secondary | ICD-10-CM

## 2020-08-27 MED ORDER — BENZONATATE 100 MG PO CAPS: 100.0000 mg | ORAL_CAPSULE | Freq: Three times a day (TID) | ORAL | 0 refills | Status: DC | PRN

## 2020-08-27 MED ORDER — NAPROXEN 500 MG PO TABS: 500.0000 mg | ORAL_TABLET | Freq: Two times a day (BID) | ORAL | 0 refills | Status: DC

## 2020-08-27 MED ORDER — AZELASTINE HCL 0.1 % NA SOLN: 1.0000 | Freq: Two times a day (BID) | NASAL | 0 refills | Status: DC

## 2020-08-27 MED ORDER — ALBUTEROL SULFATE HFA 108 (90 BASE) MCG/ACT IN AERS: 2.0000 | INHALATION_SPRAY | RESPIRATORY_TRACT | 0 refills | Status: DC | PRN

## 2020-08-27 MED ORDER — ONDANSETRON HCL 4 MG PO TABS: 4.0000 mg | ORAL_TABLET | Freq: Three times a day (TID) | ORAL | 0 refills | Status: DC | PRN

## 2020-08-27 NOTE — Progress Notes (Signed)
E-Visit for Corona Virus Screening  We are sorry you are not feeling well. We are here to help!  You have tested positive for COVID-19, meaning that you were infected with the novel coronavirus and could give the virus to others.  It is vitally important that you stay home so you do not spread it to others.      Please continue isolation at home, for at least 10 days since the start of your symptoms and until you have had 24 hours with no fever (without taking a fever reducer) and with improving of symptoms.  If you have no symptoms but tested positive (or all symptoms resolve after 5 days and you have no fever) you can leave your house but continue to wear a mask around others for an additional 5 days. If you have a fever,continue to stay home until you have had 24 hours of no fever. Most cases improve 5-10 days from onset but we have seen a small number of patients who have gotten worse after the 10 days.  Please be sure to watch for worsening symptoms and remain taking the proper precautions.   Go to the nearest hospital ED for assessment if fever/cough/breathlessness are severe or illness seems like a threat to life.    The following symptoms may appear 2-14 days after exposure: . Fever . Cough . Shortness of breath or difficulty breathing . Chills . Repeated shaking with chills . Muscle pain . Headache . Sore throat . New loss of taste or smell . Fatigue . Congestion or runny nose . Nausea or vomiting . Diarrhea  You have been enrolled in [place] for COVID-19. Daily you will receive a questionnaire within the [facility] website. Our COVID-19 response team will be monitoring your responses daily.  You can use medication such as A prescription cough medication called Tessalon Perles 100 mg. You may take 1-2 capsules every 8 hours as needed for cough, A prescription inhaler called Albuterol MDI 90 mcg /actuation 2 puffs every 4 hours as needed for shortness of breath,  wheezing, cough, A prescription anti-inflammatory called Naprosyn 500 mg. Take twice daily as needed for fever or body aches for 2 weeks, A prescription for Azelastine nasal spray 2 sprays in each nostril twice per day and A prescription for Zofran 4 mg tablets 1 every 6 hours if needed for nausea  You have tested positive for Covid but because you are not considered high risk you do not qualify for monoclonal antibody infusion.  Supportive care is all that is needed.   You may also take acetaminophen (Tylenol) as needed for fever.  HOME CARE: . Only take medications as instructed by your medical team. . Drink plenty of fluids and get plenty of rest. . A steam or ultrasonic humidifier can help if you have congestion.   GET HELP RIGHT AWAY IF YOU HAVE EMERGENCY WARNING SIGNS.  Call 911 or proceed to your closest emergency facility if: . You develop worsening high fever. . Trouble breathing . Bluish lips or face . Persistent pain or pressure in the chest . New confusion . Inability to wake or stay awake . You cough up blood. . Your symptoms become more severe . Inability to hold down food or fluids  This list is not all possible symptoms. Contact your medical provider for any symptoms that are severe or concerning to you.    Your e-visit answers were reviewed by a board certified advanced clinical practitioner to complete your personal  care plan.  Depending on the condition, your plan could have included both over the counter or prescription medications.  If there is a problem please reply once you have received a response from your provider.  Your safety is important to [place].  If you have drug allergies check your prescription carefully.    You can use MyChart to ask questions about today's visit, request a non-urgent call back, or ask for a work or school excuse for 24 hours related to this e-Visit. If it has been greater than 24 hours you will need to follow up with your provider, or  enter a new e-Visit to address those concerns. You will get an e-mail in the next two days asking about your experience.  I hope that your e-visit has been valuable and will speed your recovery. Thank you for using e-visits.      Greater than 5 minutes, yet less than 10 minutes of time have been spent researching, coordinating and implementing care for this patient today.

## 2020-08-29 ENCOUNTER — Other Ambulatory Visit: Payer: Self-pay

## 2020-08-29 ENCOUNTER — Encounter: Payer: Self-pay | Admitting: Emergency Medicine

## 2020-08-29 ENCOUNTER — Emergency Department
Admission: EM | Admit: 2020-08-29 | Discharge: 2020-08-29 | Disposition: A | Discharge status: Home / Self Care | Payer: 59 | Attending: Emergency Medicine | Admitting: Emergency Medicine

## 2020-08-29 ENCOUNTER — Emergency Department: Payer: 59

## 2020-08-29 DIAGNOSIS — J189 Pneumonia, unspecified organism: Secondary | ICD-10-CM | POA: Diagnosis not present

## 2020-08-29 DIAGNOSIS — R531 Weakness: Secondary | ICD-10-CM

## 2020-08-29 DIAGNOSIS — U071 COVID-19: Secondary | ICD-10-CM

## 2020-08-29 DIAGNOSIS — E860 Dehydration: Secondary | ICD-10-CM

## 2020-08-29 DIAGNOSIS — Z7982 Long term (current) use of aspirin: Secondary | ICD-10-CM | POA: Diagnosis not present

## 2020-08-29 DIAGNOSIS — Z85828 Personal history of other malignant neoplasm of skin: Secondary | ICD-10-CM | POA: Insufficient documentation

## 2020-08-29 DIAGNOSIS — R9431 Abnormal electrocardiogram [ECG] [EKG]: Secondary | ICD-10-CM | POA: Diagnosis not present

## 2020-08-29 DIAGNOSIS — R42 Dizziness and giddiness: Secondary | ICD-10-CM | POA: Insufficient documentation

## 2020-08-29 DIAGNOSIS — N179 Acute kidney failure, unspecified: Secondary | ICD-10-CM | POA: Diagnosis not present

## 2020-08-29 DIAGNOSIS — R0602 Shortness of breath: Secondary | ICD-10-CM | POA: Diagnosis not present

## 2020-08-29 LAB — CBC
HCT: 46.4 % (ref 39.0–52.0)
Hemoglobin: 15.4 g/dL (ref 13.0–17.0)
MCH: 30.3 pg (ref 26.0–34.0)
MCHC: 33.2 g/dL (ref 30.0–36.0)
MCV: 91.3 fL (ref 80.0–100.0)
Platelets: 122 10*3/uL — ABNORMAL LOW (ref 150–400)
RBC: 5.08 MIL/uL (ref 4.22–5.81)
RDW: 13.5 % (ref 11.5–15.5)
WBC: 7.1 10*3/uL (ref 4.0–10.5)
nRBC: 0 % (ref 0.0–0.2)

## 2020-08-29 LAB — BASIC METABOLIC PANEL
Anion gap: 11 (ref 5–15)
BUN: 32 mg/dL — ABNORMAL HIGH (ref 8–23)
CO2: 25 mmol/L (ref 22–32)
Calcium: 8.5 mg/dL — ABNORMAL LOW (ref 8.9–10.3)
Chloride: 101 mmol/L (ref 98–111)
Creatinine, Ser: 1.54 mg/dL — ABNORMAL HIGH (ref 0.61–1.24)
GFR, Estimated: 51 mL/min — ABNORMAL LOW (ref >60)
Glucose, Bld: 113 mg/dL — ABNORMAL HIGH (ref 70–99)
Potassium: 4.4 mmol/L (ref 3.5–5.1)
Sodium: 137 mmol/L (ref 135–145)

## 2020-08-29 MED ORDER — LACTATED RINGERS IV BOLUS
1000.0000 mL | Freq: Once | INTRAVENOUS | Status: AC
  Administered 2020-08-29: 1000 mL via INTRAVENOUS

## 2020-08-29 NOTE — ED Provider Notes (Signed)
Edward Frederick   ____________________________________________   Event Date/Time   First MD Initiated Contact with Patient 08/29/20 0912     (approximate)  I have reviewed the triage vital signs and the nursing notes.   HISTORY  Chief Complaint Weakness    HPI Edward Frederick is a 60 y.o. male with a recent status of COVID 8 days prior to arrival who presents for generalized weakness.  Patient states that he has had decreased p.o. intake over the last 2-3 days due to "things not tasting right".  Patient states that he does get orthostatic lightheadedness as well.  Denies any other exacerbating or relieving factors.  Patient currently denies any vision changes, tinnitus, difficulty speaking, facial droop, sore throat, chest pain, shortness of breath, abdominal pain, nausea/vomiting/diarrhea, dysuria, or numbness/paresthesias in any extremity         Past Medical History:  Diagnosis Date  . Basal cell carcinoma 07/01/2018   L post auricular sulcus  . Squamous cell carcinoma of skin 06/04/2017   right temple SCCis    There are no problems to display for this patient.   Past Surgical History:  Procedure Laterality Date  . CARPAL TUNNEL RELEASE      Prior to Admission medications   Medication Sig Start Date End Date Taking? Authorizing Provider  albuterol (VENTOLIN HFA) 108 (90 Base) MCG/ACT inhaler Inhale 2 puffs into the lungs every 4 (four) hours as needed for wheezing or shortness of breath (cough, shortness of breath or wheezing.). 08/27/20   McVey, Madelaine BhatElizabeth Whitney, PA-C  aspirin EC 81 MG tablet Take 81 mg by mouth daily.    [provider]  azelastine (ASTELIN) 0.1 % nasal spray Place 1 spray into both nostrils 2 (two) times daily. Use in each nostril as directed 08/27/20   McVey, Madelaine BhatElizabeth Whitney, PA-C  benzonatate (TESSALON) 100 MG capsule Take 1-2 capsules (100-200 mg total) by mouth 3 (three) times  daily as needed for cough. 08/27/20   McVey, Madelaine BhatElizabeth Whitney, PA-C  chlorpheniramine-HYDROcodone (TUSSIONEX PENNKINETIC ER) 10-8 MG/5ML SUER Take 5 mLs by mouth every 12 (twelve) hours as needed for cough. Patient not taking: Reported on 08/24/2017 09/23/16   Faythe GheeFisher, Susan W, PA-C  ciclopirox (LOPROX) 0.77 % cream Apply  a small amount   as directed QD/BID to feet, in between toes 09/22/19   [provider]  hydrocortisone 2.5 % ointment Apply topically 2 (two) times daily. For up to 1 week 05/31/20   Moye, [place], MD  ketoconazole (NIZORAL) 2 % cream Apply     as directed QD/BID to AA's PRN rash face 09/22/19   [provider]  losartan (COZAAR) 25 MG tablet Take by mouth. 10/09/17 10/09/18  [provider]  losartan (COZAAR) 50 MG tablet Take by mouth. 06/29/19   [provider]  naproxen (NAPROSYN) 500 MG tablet Take 1 tablet (500 mg total) by mouth 2 (two) times daily with a meal. 08/27/20   McVey, Madelaine BhatElizabeth Whitney, PA-C  ondansetron (ZOFRAN) 4 MG tablet Take 1 tablet (4 mg total) by mouth every 8 (eight) hours as needed for nausea or vomiting. 08/27/20   McVey, Madelaine BhatElizabeth Whitney, PA-C  terbinafine (LAMISIL) 250 MG tablet Take 1 by mouth x 1 week. Repeat at month 3, 5 and 7. 11/22/19   Moye, [place], MD  testosterone (ANDROGEL) 50 MG/5GM (1%) GEL Place 5 g onto the skin daily.    [provider]  triamcinolone cream (KENALOG) 0.1 % Apply  a small amount to affected area  as directed QD/BID to AA's ears, bug bites PRN. Avoid F/G/A 09/22/19   [provider]    Allergies Patient has no known allergies.  History reviewed. No pertinent family history.  Social History Social History   Tobacco Use  . Smoking status: Never Smoker  . Smokeless tobacco: Never Used  Substance Use Topics  . Alcohol use: No    Alcohol/week: 0.0 standard drinks  . Drug use: No    Review of Systems Constitutional: No fever/chills Eyes: No visual changes. ENT:  No sore throat. Cardiovascular: Denies chest pain. Respiratory: Denies shortness of breath. Gastrointestinal: No abdominal pain.  No nausea, no vomiting.  No diarrhea. Genitourinary: Negative for dysuria. Musculoskeletal: Negative for acute arthralgias Skin: Negative for rash. Neurological: Endorses generalized weakness.  Negative for headaches, numbness/paresthesias in any extremity Psychiatric: Negative for suicidal ideation/homicidal ideation   ____________________________________________   PHYSICAL EXAM:  VITAL SIGNS: ED Triage Vitals [08/29/20 0902]  Enc Vitals Group     BP (!) 142/111     Pulse Rate 93     Resp 18     Temp 98.2 F (36.8 C)     Temp Source Oral     SpO2 96 %     Weight 215 lb (97.5 kg)     Height 5\' 8"  (1.727 m)     Head Circumference      Peak Flow      Pain Score 0     Pain Loc      Pain Edu?      Excl. in [place]?    Constitutional: Alert and oriented. Well appearing and in no acute distress. Eyes: Conjunctivae are normal. PERRL. Head: Atraumatic. Nose: No congestion/rhinnorhea. Mouth/Throat: Mucous membranes are moist. Neck: No stridor Cardiovascular: Grossly normal heart sounds.  Good peripheral circulation. Respiratory: Normal respiratory effort.  No retractions. Gastrointestinal: Soft and nontender. No distention. Musculoskeletal: No obvious deformities Neurologic:  Normal speech and language. No gross focal neurologic deficits are appreciated. Skin:  Skin is warm and dry. No rash noted. Psychiatric: Mood and affect are normal. Speech and behavior are normal.  ____________________________________________   LABS (all labs ordered are listed, but only abnormal results are displayed)  Labs Reviewed  BASIC METABOLIC PANEL - Abnormal; Notable for the following components:      Result Value   Glucose, Bld 113 (*)    BUN 32 (*)    Creatinine, Ser 1.54 (*)    Calcium 8.5 (*)    GFR, Estimated 51 (*)    All other components within normal  limits  CBC - Abnormal; Notable for the following components:   Platelets 122 (*)    All other components within normal limits  URINALYSIS, COMPLETE (UACMP) WITH MICROSCOPIC   ____________________________________________  EKG  ED ECG REPORT I, Naaman Plummer, the attending physician, personally viewed and interpreted this ECG.  Date: 08/29/2020 EKG Time: 0847 Rate: 96 Rhythm: normal sinus rhythm QRS Axis: normal Intervals: normal ST/T Wave abnormalities: normal Narrative Interpretation: no evidence of acute ischemia  ____________________________________________  RADIOLOGY  ED MD interpretation: 2 view x-ray of the chest shows patchy bilateral airspace disease consistent with viral pneumonia.  No evidence of pneumothorax or wide mediastinum  Official radiology report(s): DG Chest 2 View  Result Date: 08/29/2020 CLINICAL DATA:  Shortness of breath EXAM: CHEST - 2 VIEW COMPARISON:  None. FINDINGS: Patchy bilateral pneumonia with prominent subpleural involvement. No edema, effusion, or pneumothorax. Normal heart size and mediastinal contours.  Generalized degenerative disease. IMPRESSION: Multifocal pneumonia. Electronically Signed   By: Monte Fantasia M.D.   On: 08/29/2020 09:43    ____________________________________________   PROCEDURES  Procedure(s) performed (including Critical Care):  Procedures   ____________________________________________   INITIAL IMPRESSION / ASSESSMENT AND PLAN / ED COURSE  As part of my medical decision making, I reviewed the following data within the [facility] notes reviewed and incorporated, Labs reviewed, EKG interpreted, Old chart reviewed, Radiograph reviewed and Notes from prior ED visits reviewed and incorporated        Presentation most consistent with Viral Syndrome.  Patient has tested positive for COVID-19. Based on vitals and exam they are nontoxic and stable for discharge.  Given History and Exam  I have a lower suspicion for: Emergent CardioPulmonary causes [such as Acute Asthma or COPD Exacerbation, acute Heart Failure or exacerbation, PE, PTX, atypical ACS, PNA]. Emergent Otolaryngeal causes [such as PTA, RPA, Lud[place]igs, Epiglottitis, EBV].  Regarding Emergent Travel or Immunosuppressive related infectious: I have a low suspicion for acute HIV. Patient shows evidence of dehydration with slight creatinine elevation and decreased GFR.  Patient still making urine and increased urine output after fluid administration.  Patient not hypotensive. Will provide strict return precautions and instructions on self-isolation/quarantine and anticipatory guidance.      ____________________________________________   FINAL CLINICAL IMPRESSION(S) / ED DIAGNOSES  Final diagnoses:  COVID-19 virus infection  Dehydration  Generalized weakness  AKI (acute kidney injury) [facility])     ED Discharge Orders    None       [place]:  This document was prepared using Dragon voice recognition software and may include unintentional dictation errors.   Naaman Plummer, MD 08/29/20 1043

## 2020-08-29 NOTE — Discharge Instructions (Signed)
Please continue oral rehydration at home

## 2020-08-29 NOTE — ED Notes (Signed)
Pt signed paper copy of signature to be discharged. Paper placed in tray to be sent to medical records.

## 2020-08-29 NOTE — ED Triage Notes (Signed)
Pt comes into the ED via POV c/o weakness that has increased since being diagnosed with COVID on 08/23/20.  Pt states he has only had 1./2 a glass of a pixie cup of water.  Pt has decreased appetite and states he is urinating very little.  Pt currently has even and unlabored respirations at this time.  Pt denies any SHOB, CP, or N/V.

## 2020-10-31 ENCOUNTER — Ambulatory Visit: Payer: 59 | Admitting: Dermatology

## 2020-11-21 ENCOUNTER — Other Ambulatory Visit: Payer: Self-pay

## 2020-11-21 ENCOUNTER — Ambulatory Visit (INDEPENDENT_AMBULATORY_CARE_PROVIDER_SITE_OTHER): Payer: 59 | Admitting: Dermatology

## 2020-11-21 DIAGNOSIS — L814 Other melanin hyperpigmentation: Secondary | ICD-10-CM | POA: Diagnosis not present

## 2020-11-21 DIAGNOSIS — D229 Melanocytic nevi, unspecified: Secondary | ICD-10-CM

## 2020-11-21 DIAGNOSIS — Z1283 Encounter for screening for malignant neoplasm of skin: Secondary | ICD-10-CM | POA: Diagnosis not present

## 2020-11-21 DIAGNOSIS — Z872 Personal history of diseases of the skin and subcutaneous tissue: Secondary | ICD-10-CM

## 2020-11-21 DIAGNOSIS — Z85828 Personal history of other malignant neoplasm of skin: Secondary | ICD-10-CM

## 2020-11-21 DIAGNOSIS — L578 Other skin changes due to chronic exposure to nonionizing radiation: Secondary | ICD-10-CM | POA: Diagnosis not present

## 2020-11-21 DIAGNOSIS — B351 Tinea unguium: Secondary | ICD-10-CM

## 2020-11-21 DIAGNOSIS — D1800 Hemangioma unspecified site: Secondary | ICD-10-CM | POA: Diagnosis not present

## 2020-11-21 DIAGNOSIS — L821 Other seborrheic keratosis: Secondary | ICD-10-CM | POA: Diagnosis not present

## 2020-11-21 DIAGNOSIS — Z86007 Personal history of in-situ neoplasm of skin: Secondary | ICD-10-CM

## 2020-11-21 DIAGNOSIS — L820 Inflamed seborrheic keratosis: Secondary | ICD-10-CM

## 2020-11-21 MED ORDER — TERBINAFINE HCL 250 MG PO TABS
250.0000 mg | ORAL_TABLET | Freq: Every day | ORAL | 0 refills | Status: DC
  Filled 2020-11-21: qty 7, 7d supply, fill #0

## 2020-11-21 NOTE — Progress Notes (Signed)
Follow-Up Visit   Subjective  Edward Frederick is a 60 y.o. male who presents for the following: FBSE (Patient here for full body skin exam and skin cancer screening. Patient with a history of BCC and SCCis. There is a spot at nose that is crusty but has been like that for many years.).  Patient also treating seb derm with HC 2.5% cream as needed but advises it is improved.   The following portions of the chart were reviewed this encounter and updated as appropriate:   Tobacco  Allergies  Meds  Problems  Med Hx  Surg Hx  Fam Hx      Review of Systems:  No other skin or systemic complaints except as noted in HPI or Assessment and Plan.  Objective  Well appearing patient in no apparent distress; mood and affect are within normal limits.  A full examination was performed including scalp, head, eyes, ears, nose, lips, neck, chest, axillae, abdomen, back, buttocks, bilateral upper extremities, bilateral lower extremities, hands, feet, fingers, toes, fingernails, and toenails. All findings within normal limits unless otherwise noted below.  Objective  toenails: Thickened toenails with subungual debris  Objective  left temple: Erythematous keratotic or waxy stuck-on papule or plaque.    Assessment & Plan  Onychomycosis toenails  Chronic condition with duration over one year. Condition is bothersome to patient. Currently flared.  S/p pulse terbinafine therapy  Chronic and persistent  Fungal culture today  Start terbinafine 250mg  daily x 1 week #7 0RF while awaiting fungal cultures  Terbinafine Counseling  Terbinafine is an anti-fungal medicine that can be applied to the skin (over the counter) or taken by mouth (prescription) to treat fungal infections. The pill version is often used to treat fungal infections of the nails or scalp. While most people do not have any side effects from taking terbinafine pills, some possible side effects of the medicine can include taste  changes, headache, loss of smell, vision changes, nausea, vomiting, or diarrhea.   Rare side effects can include irritation of the liver, allergic reaction, or decrease in blood counts (which may show up as not feeling well or developing an infection). If you are concerned about any of these side effects, please stop the medicine and call your doctor, or in the case of an emergency such as feeling very unwell, seek immediate medical care.     Other Related Procedures Fungus Culture W/Rfx Rapid ID  Ordered Medications: terbinafine (LAMISIL) 250 MG tablet  Other Related Medications terbinafine (LAMISIL) 250 MG tablet  Inflamed seborrheic keratosis left temple  Prior to procedure, discussed risks of blister formation, small wound, skin dyspigmentation, or rare scar following cryotherapy.    Destruction of lesion - left temple  Destruction method: cryotherapy   Informed consent: discussed and consent obtained   Lesion destroyed using liquid nitrogen: Yes   Cryotherapy cycles:  2 Outcome: patient tolerated procedure well with no complications   Post-procedure details: wound care instructions given     Lentigines - Scattered tan macules - Due to sun exposure - Benign-appering, observe - Recommend daily broad spectrum sunscreen SPF 30+ to sun-exposed areas, reapply every 2 hours as needed. - Call for any changes  Seborrheic Keratoses - Stuck-on, waxy, tan-brown papules and/or plaques  - Benign-appearing - Discussed benign etiology and prognosis. - Observe - Call for any changes  Melanocytic Nevi - Tan-brown and/or pink-flesh-colored symmetric macules and papules - Benign appearing on exam today - Observation - Call clinic for new or changing  moles - Recommend daily use of broad spectrum spf 30+ sunscreen to sun-exposed areas.   Hemangiomas - Red papules - Discussed benign nature - Observe - Call for any changes  Actinic Damage - Chronic condition, secondary to  cumulative UV/sun exposure - diffuse scaly erythematous macules with underlying dyspigmentation - Recommend daily broad spectrum sunscreen SPF 30+ to sun-exposed areas, reapply every 2 hours as needed.  - Staying in the shade or wearing long sleeves, sun glasses (UVA+UVB protection) and wide brim hats (4-inch brim around the entire circumference of the hat) are also recommended for sun protection.  - Call for new or changing lesions.  Skin cancer screening performed today.  History of Basal Cell Carcinoma of the Skin - No evidence of recurrence today - Recommend regular full body skin exams - Recommend daily broad spectrum sunscreen SPF 30+ to sun-exposed areas, reapply every 2 hours as needed.  - Call if any new or changing lesions are noted between office visits  History of Squamous Cell Carcinoma in Situ of the Skin - No evidence of recurrence today - Recommend regular full body skin exams - Recommend daily broad spectrum sunscreen SPF 30+ to sun-exposed areas, reapply every 2 hours as needed.  - Call if any new or changing lesions are noted between office visits  History of PreCancerous Actinic Keratosis  - site(s) of PreCancerous Actinic Keratosis clear today. - these may recur and new lesions may form requiring treatment to prevent transformation into skin cancer - observe for new or changing spots and contact [place] for appointment if occur - photoprotection with sun protective clothing; sunglasses and broad spectrum sunscreen with SPF of at least 30 + and frequent self skin exams recommended - yearly exams by a dermatologist recommended for persons with history of PreCancerous Actinic Keratoses  Return in about 6 months (around 05/23/2021) for TBSE.  Graciella Belton, RMA, am acting as scribe for Forest Gleason, MD .  Documentation: I have reviewed the above documentation for accuracy and completeness, and I agree with the above.  Forest Gleason, MD

## 2020-11-21 NOTE — Patient Instructions (Addendum)
Melanoma ABCDEs  Melanoma is the most dangerous type of skin cancer, and is the leading cause of death from skin disease.  You are more likely to develop melanoma if you:  Have light-colored skin, light-colored eyes, or red or blond hair  Spend a lot of time in the sun  Tan regularly, either outdoors or in a tanning bed  Have had blistering sunburns, especially during childhood  Have a close family member who has had a melanoma  Have atypical moles or large birthmarks  Early detection of melanoma is key since treatment is typically straightforward and cure rates are extremely high if we catch it early.   The first sign of melanoma is often a change in a mole or a new dark spot.  The ABCDE system is a way of remembering the signs of melanoma.  A for asymmetry:  The two halves do not match. B for border:  The edges of the growth are irregular. C for color:  A mixture of colors are present instead of an even brown color. D for diameter:  Melanomas are usually (but not always) greater than 87mm - the size of a pencil eraser. E for evolution:  The spot keeps changing in size, shape, and color.  Please check your skin once per month between visits. You can use a small mirror in front and a large mirror behind you to keep an eye on the back side or your body.   If you see any new or changing lesions before your next follow-up, please call to schedule a visit.  Please continue daily skin protection including broad spectrum sunscreen SPF 30+ to sun-exposed areas, reapplying every 2 hours as needed when you're outdoors.   Recommend taking Heliocare sun protection supplement daily in sunny weather for additional sun protection. For maximum protection on the sunniest days, you can take up to 2 capsules of regular Heliocare OR take 1 capsule of Heliocare Ultra. For prolonged exposure (such as a full day in the sun), you can repeat your dose of the supplement 4 hours after your first dose. Heliocare  can be purchased at [facility] or at [email or web address].   Cryotherapy Aftercare  . Wash gently with soap and water everyday.   Marland Kitchen Apply Vaseline and Band-Aid daily until healed.  Prior to procedure, discussed risks of blister formation, small wound, skin dyspigmentation, or rare scar following cryotherapy.   If you have any questions or concerns for your doctor, please call our main line at [phone] and press option 4 to reach your doctor's medical assistant. If no one answers, please leave a voicemail as directed and we will return your call as soon as possible. Messages left after 4 pm will be answered the following business day.   You may also send [place] a message via [place]. We typically respond to MyChart messages within 1-2 business days.  For prescription refills, please ask your pharmacy to contact our office. Our fax number is [phone].  If you have an urgent issue when the clinic is closed that cannot wait until the next business day, you can page your doctor at the number below.    Please note that while we do our best to be available for urgent issues outside of office hours, we are not available 24/7.   If you have an urgent issue and are unable to reach [place], you may choose to seek medical care at your doctor's office, retail clinic, urgent care center, or emergency room.  If  you have a medical emergency, please immediately call 911 or go to the emergency department.  Pager Numbers  - Dr. Nehemiah Massed: [phone]  - Dr. Laurence Ferrari: [phone]  - Dr. Nicole Kindred: [phone]  In the event of inclement weather, please call our main line at [phone] for an update on the status of any delays or closures.  Dermatology Medication Tips: Please keep the boxes that topical medications come in in order to help keep track of the instructions about where and how to use these. Pharmacies typically print the medication instructions only on the boxes and not directly on the  medication tubes.   If your medication is too expensive, please contact our office at [phone] option 4 or send [place] a message through [place].   We are unable to tell what your co-pay for medications will be in advance as this is different depending on your insurance coverage. However, we may be able to find a substitute medication at lower cost or fill out paperwork to get insurance to cover a needed medication.   If a prior authorization is required to get your medication covered by your insurance company, please allow [place] 1-2 business days to complete this process.  Drug prices often vary depending on where the prescription is filled and some pharmacies may offer cheaper prices.  The website www.goodrx.com contains coupons for medications through different pharmacies. The prices here do not account for what the cost may be with help from insurance (it may be cheaper with your insurance), but the website can give you the price if you did not use any insurance.  - You can print the associated coupon and take it with your prescription to the pharmacy.  - You may also stop by our office during regular business hours and pick up a GoodRx coupon card.  - If you need your prescription sent electronically to a different pharmacy, notify our office through [facility] or by phone at [phone] option 4.   Terbinafine Counseling  Terbinafine is an anti-fungal medicine that can be applied to the skin (over the counter) or taken by mouth (prescription) to treat fungal infections. The pill version is often used to treat fungal infections of the nails or scalp. While most people do not have any side effects from taking terbinafine pills, some possible side effects of the medicine can include taste changes, headache, loss of smell, vision changes, nausea, vomiting, or diarrhea.   Rare side effects can include irritation of the liver, allergic reaction, or decrease in blood counts (which may show  up as not feeling well or developing an infection). If you are concerned about any of these side effects, please stop the medicine and call your doctor, or in the case of an emergency such as feeling very unwell, seek immediate medical care.

## 2020-11-22 ENCOUNTER — Other Ambulatory Visit: Payer: Self-pay

## 2020-11-22 MED FILL — Testosterone TD Gel 20.25 MG/ACT (1.62%): TRANSDERMAL | 30 days supply | Qty: 75 | Fill #0 | Status: AC

## 2020-11-29 ENCOUNTER — Encounter: Payer: Self-pay | Admitting: Dermatology

## 2020-12-21 ENCOUNTER — Other Ambulatory Visit: Payer: Self-pay

## 2020-12-21 LAB — FUNGAL ID BY MOLECULAR METHODS

## 2020-12-21 LAB — FUNGUS CULTURE W/RFX RAPID ID: Fungal Culture W/Rfx: POSITIVE — AB

## 2020-12-21 MED FILL — Testosterone TD Gel 20.25 MG/ACT (1.62%): TRANSDERMAL | 30 days supply | Qty: 75 | Fill #1 | Status: AC

## 2020-12-25 ENCOUNTER — Encounter: Payer: Self-pay | Admitting: Dermatology

## 2020-12-25 ENCOUNTER — Telehealth: Payer: Self-pay | Admitting: Dermatology

## 2020-12-25 DIAGNOSIS — B351 Tinea unguium: Secondary | ICD-10-CM

## 2020-12-25 MED ORDER — TERBINAFINE HCL 250 MG PO TABS
ORAL_TABLET | ORAL | 0 refills | Status: DC
  Filled 2020-12-25: qty 21, 84d supply, fill #0

## 2020-12-25 NOTE — Telephone Encounter (Signed)
Terbinafine refilled

## 2020-12-26 ENCOUNTER — Other Ambulatory Visit: Payer: Self-pay

## 2021-01-21 ENCOUNTER — Other Ambulatory Visit: Payer: Self-pay

## 2021-01-21 MED ORDER — LOSARTAN POTASSIUM 50 MG PO TABS
1.0000 | ORAL_TABLET | Freq: Every day | ORAL | 1 refills | Status: DC
  Filled 2021-01-21: qty 90, 90d supply, fill #0
  Filled 2021-05-21: qty 90, 90d supply, fill #1

## 2021-02-19 DIAGNOSIS — D696 Thrombocytopenia, unspecified: Secondary | ICD-10-CM | POA: Diagnosis not present

## 2021-02-19 DIAGNOSIS — I10 Essential (primary) hypertension: Secondary | ICD-10-CM | POA: Diagnosis not present

## 2021-02-19 DIAGNOSIS — E291 Testicular hypofunction: Secondary | ICD-10-CM | POA: Diagnosis not present

## 2021-02-19 DIAGNOSIS — Z0000 Encounter for general adult medical examination without abnormal findings: Secondary | ICD-10-CM | POA: Diagnosis not present

## 2021-02-21 DIAGNOSIS — Z0000 Encounter for general adult medical examination without abnormal findings: Secondary | ICD-10-CM | POA: Diagnosis not present

## 2021-02-21 DIAGNOSIS — Z125 Encounter for screening for malignant neoplasm of prostate: Secondary | ICD-10-CM | POA: Diagnosis not present

## 2021-02-21 DIAGNOSIS — Z13220 Encounter for screening for lipoid disorders: Secondary | ICD-10-CM | POA: Diagnosis not present

## 2021-02-21 DIAGNOSIS — I10 Essential (primary) hypertension: Secondary | ICD-10-CM | POA: Diagnosis not present

## 2021-02-21 DIAGNOSIS — D696 Thrombocytopenia, unspecified: Secondary | ICD-10-CM | POA: Diagnosis not present

## 2021-03-06 ENCOUNTER — Encounter: Payer: Self-pay | Admitting: *Deleted

## 2021-03-13 ENCOUNTER — Other Ambulatory Visit: Payer: Self-pay

## 2021-03-14 ENCOUNTER — Other Ambulatory Visit: Payer: Self-pay

## 2021-03-14 MED ORDER — TESTOSTERONE 20.25 MG/ACT (1.62%) TD GEL
TRANSDERMAL | 5 refills | Status: DC
  Filled 2021-03-14: qty 75, 30d supply, fill #0
  Filled 2021-05-21: qty 75, 30d supply, fill #1
  Filled 2021-07-29: qty 75, 30d supply, fill #2
  Filled 2021-08-29: qty 75, 30d supply, fill #3

## 2021-04-25 DIAGNOSIS — R739 Hyperglycemia, unspecified: Secondary | ICD-10-CM | POA: Diagnosis not present

## 2021-05-08 IMAGING — CR DG CHEST 2V
1 series · 2 of 2 positions shown · non-contrast
Comparison: None.

CLINICAL DATA: Shortness of breath

EXAM:
CHEST - 2 VIEW

[Series 1: dg chest 2 view · 0.14mm/px · 2 of 2 slices shown]
[im 1/2]
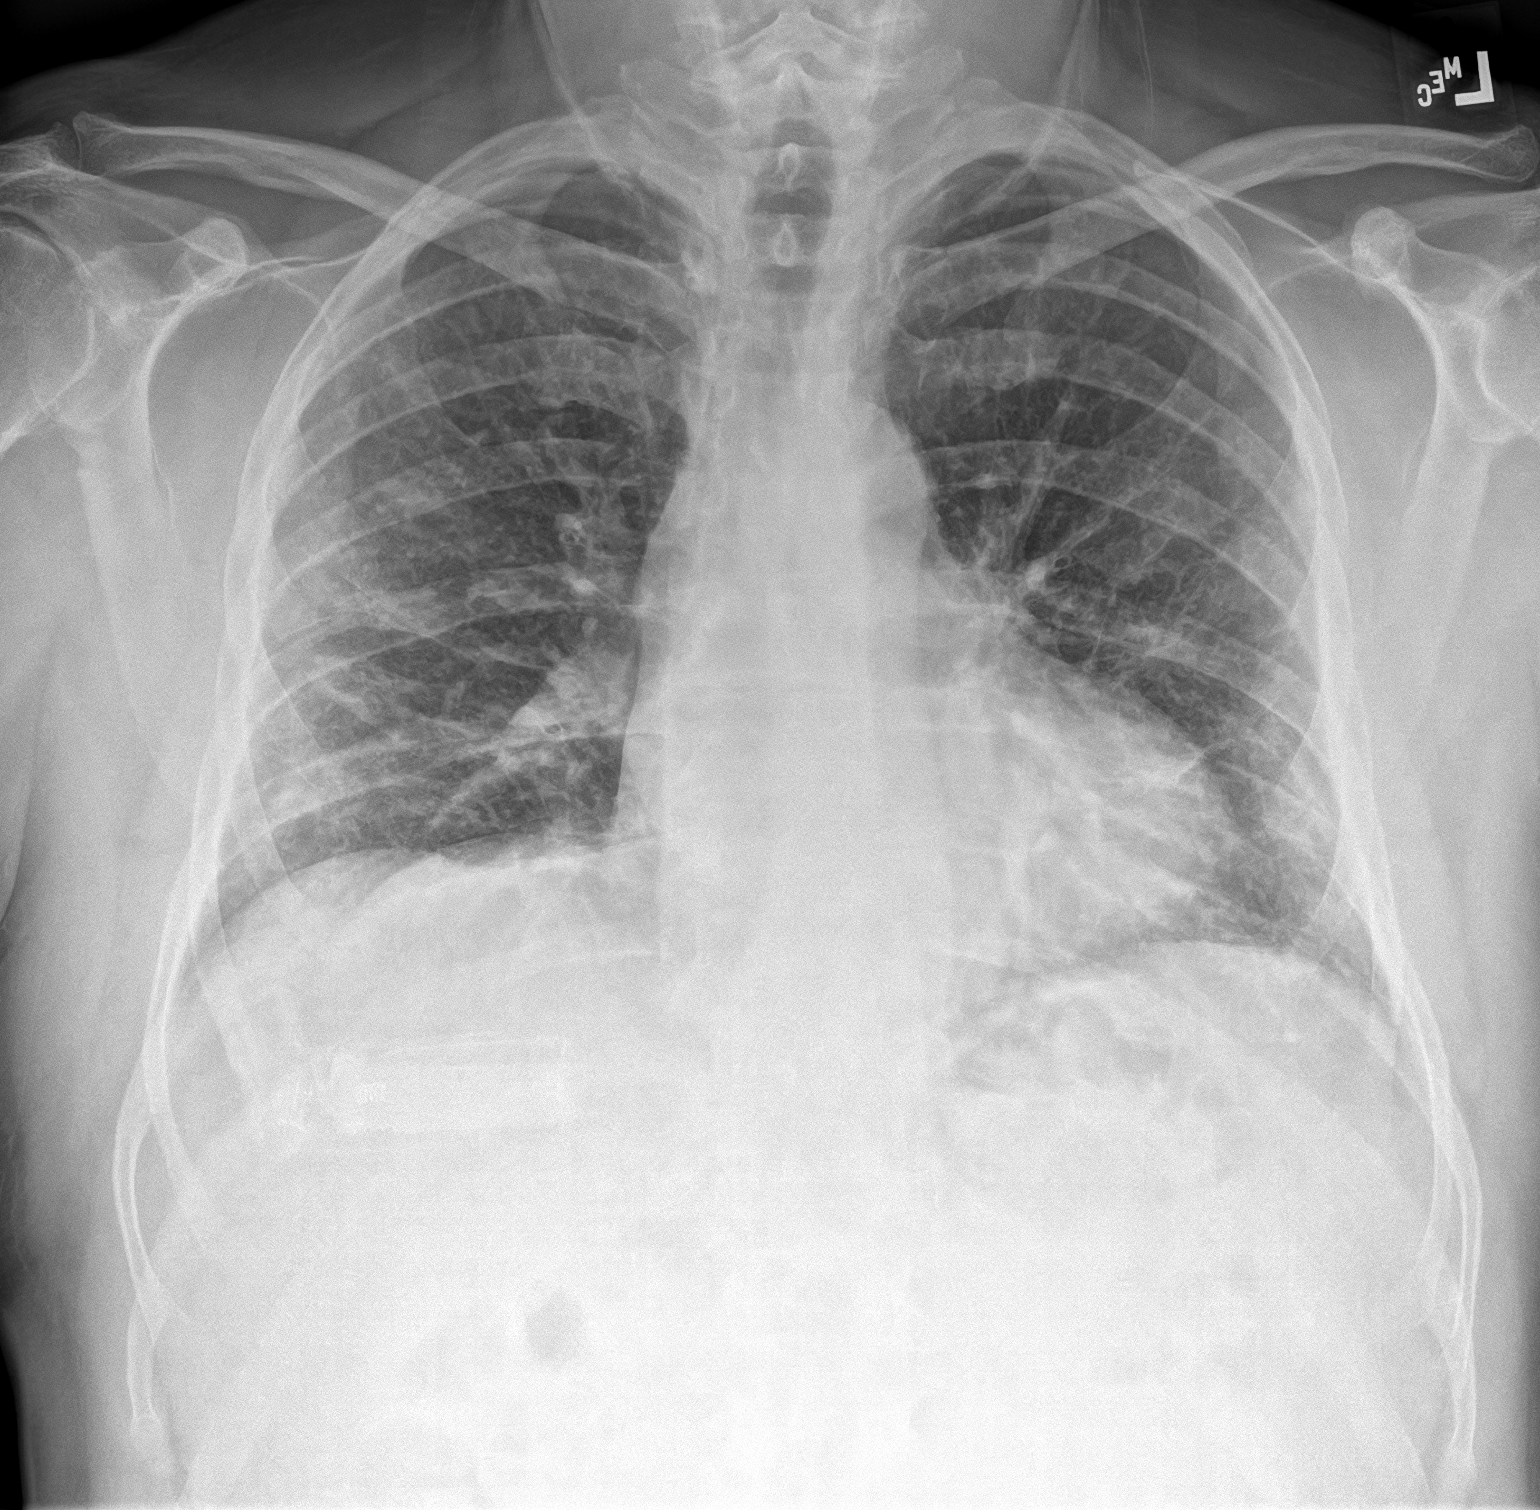
[im 2/2]
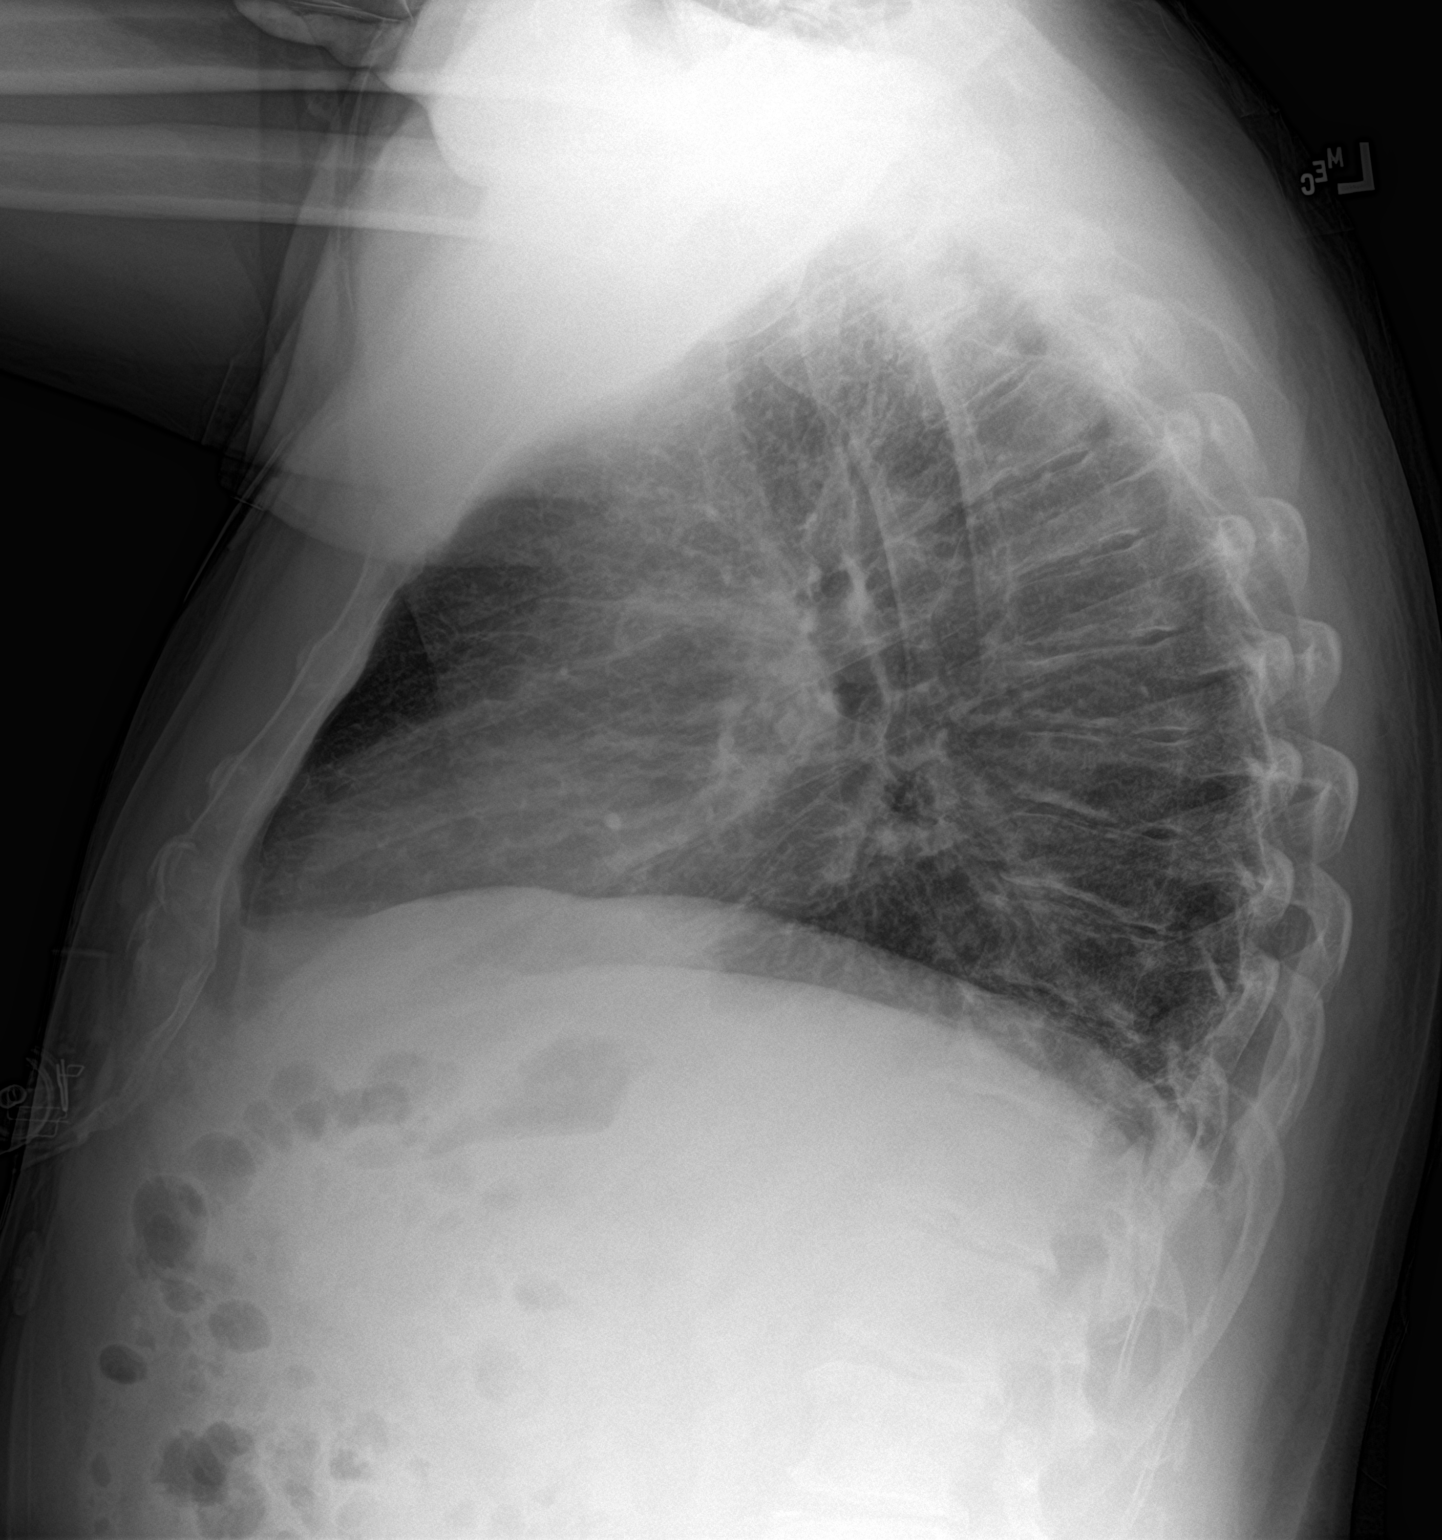

[2 of 2 positions shown; findings below may reference images not displayed]

FINDINGS: Patchy bilateral pneumonia with prominent subpleural involvement. No
edema, effusion, or pneumothorax. Normal heart size and mediastinal
contours. Generalized degenerative disease.
IMPRESSION: Multifocal pneumonia.

## 2021-05-21 ENCOUNTER — Other Ambulatory Visit: Payer: Self-pay

## 2021-05-22 ENCOUNTER — Other Ambulatory Visit: Payer: Self-pay

## 2021-05-22 MED ORDER — LOSARTAN POTASSIUM 50 MG PO TABS
50.0000 mg | ORAL_TABLET | Freq: Every day | ORAL | 1 refills | Status: DC
  Filled 2021-05-22 – 2021-08-29 (×2): qty 90, 90d supply, fill #0
  Filled 2021-12-02: qty 90, 90d supply, fill #1

## 2021-05-30 ENCOUNTER — Encounter: Payer: Self-pay | Admitting: Dermatology

## 2021-05-30 ENCOUNTER — Other Ambulatory Visit: Payer: Self-pay

## 2021-05-30 ENCOUNTER — Ambulatory Visit (INDEPENDENT_AMBULATORY_CARE_PROVIDER_SITE_OTHER): Payer: 59 | Admitting: Dermatology

## 2021-05-30 DIAGNOSIS — L821 Other seborrheic keratosis: Secondary | ICD-10-CM

## 2021-05-30 DIAGNOSIS — Z1283 Encounter for screening for malignant neoplasm of skin: Secondary | ICD-10-CM | POA: Diagnosis not present

## 2021-05-30 DIAGNOSIS — L814 Other melanin hyperpigmentation: Secondary | ICD-10-CM

## 2021-05-30 DIAGNOSIS — L578 Other skin changes due to chronic exposure to nonionizing radiation: Secondary | ICD-10-CM

## 2021-05-30 DIAGNOSIS — D229 Melanocytic nevi, unspecified: Secondary | ICD-10-CM | POA: Diagnosis not present

## 2021-05-30 DIAGNOSIS — D1800 Hemangioma unspecified site: Secondary | ICD-10-CM

## 2021-05-30 DIAGNOSIS — Z85828 Personal history of other malignant neoplasm of skin: Secondary | ICD-10-CM | POA: Diagnosis not present

## 2021-05-30 DIAGNOSIS — L820 Inflamed seborrheic keratosis: Secondary | ICD-10-CM | POA: Diagnosis not present

## 2021-05-30 DIAGNOSIS — B351 Tinea unguium: Secondary | ICD-10-CM

## 2021-05-30 NOTE — Patient Instructions (Signed)

## 2021-05-30 NOTE — Progress Notes (Signed)
Follow-Up Visit   Subjective  Edward Frederick is a 60 y.o. male who presents for the following: Annual Exam (L upper eyelid lesion - new and irregular. He also has other skin lesions on his face and R forearm that he would like checked and treated. ). The patient presents for Total-Body Skin Exam (TBSE) for skin cancer screening and mole check.  The following portions of the chart were reviewed this encounter and updated as appropriate:   Tobacco  Allergies  Meds  Problems  Med Hx  Surg Hx  Fam Hx      Review of Systems:  No other skin or systemic complaints except as noted in HPI or Assessment and Plan.  Objective  Well appearing patient in no apparent distress; mood and affect are within normal limits.  A full examination was performed including scalp, head, eyes, ears, nose, lips, neck, chest, axillae, abdomen, back, buttocks, bilateral upper extremities, bilateral lower extremities, hands, feet, fingers, toes, fingernails, and toenails. All findings within normal limits unless otherwise noted below.  B/L toenails Nails thickened with subungual debris but proximal nail normalizing  R forearm x 1, face x 7, L upper eyelid x 1 (9) Erythematous keratotic or waxy stuck-on papule or plaque.    Assessment & Plan  Onychomycosis B/L toenails  Chronic condition with duration or expected duration over one year. Condition is bothersome to patient. Not currently at goal.  Fungal culture positive for penicillium species - improving with treatment  Continue Terbinafine pulse dosing (terbinafine 250 mg daily x 7 days, repeat every 2 months). If not grown out by January patient to call to extend Terbinafine treatment.   Terbinafine Counseling  Terbinafine is an anti-fungal medicine that can be applied to the skin (over the counter) or taken by mouth (prescription) to treat fungal infections. The pill version is often used to treat fungal infections of the nails or scalp. While most  people do not have any side effects from taking terbinafine pills, some possible side effects of the medicine can include taste changes, headache, loss of smell, vision changes, nausea, vomiting, or diarrhea.   Rare side effects can include irritation of the liver, allergic reaction, or decrease in blood counts (which may show up as not feeling well or developing an infection). If you are concerned about any of these side effects, please stop the medicine and call your doctor, or in the case of an emergency such as feeling very unwell, seek immediate medical care.    Related Medications terbinafine (LAMISIL) 250 MG tablet Take 1 by mouth for 7 days in [facility] June. Repeat at mid-August, mid October, and mid-December  Inflamed seborrheic keratosis R forearm x 1, face x 7, L upper eyelid x 1  Prior to procedure, discussed risks of blister formation, small wound, skin dyspigmentation, or rare scar following cryotherapy. Recommend Vaseline ointment to treated areas while healing.   Destruction of lesion - R forearm x 1, face x 7, L upper eyelid x 1 Complexity: simple   Destruction method: cryotherapy   Informed consent: discussed and consent obtained   Timeout:  patient name, date of birth, surgical site, and procedure verified Lesion destroyed using liquid nitrogen: Yes   Region frozen until ice ball extended beyond lesion: Yes   Outcome: patient tolerated procedure well with no complications   Post-procedure details: wound care instructions given    Lentigines - Scattered tan macules - Due to sun exposure - Benign-appearing, observe - Recommend daily broad spectrum sunscreen  SPF 30+ to sun-exposed areas, reapply every 2 hours as needed. - Call for any changes  Seborrheic Keratoses - Stuck-on, waxy, tan-brown papules and/or plaques  - Benign-appearing - Discussed benign etiology and prognosis. - Observe - Call for any changes  Melanocytic Nevi - Tan-brown and/or pink-flesh-colored  symmetric macules and papules - Benign appearing on exam today - Observation - Call clinic for new or changing moles - Recommend daily use of broad spectrum spf 30+ sunscreen to sun-exposed areas.   Hemangiomas - Red papules - Discussed benign nature - Observe - Call for any changes  Actinic Damage - Severe, confluent actinic changes with pre-cancerous actinic keratoses  - Severe, chronic, not at goal, secondary to cumulative UV radiation exposure over time - diffuse scaly erythematous macules and papules with underlying dyspigmentation - Discussed Prescription "Field Treatment" for Severe, Chronic Confluent Actinic Changes with Pre-Cancerous Actinic Keratoses Field treatment involves treatment of an entire area of skin that has confluent Actinic Changes (Sun/ Ultraviolet light damage) and PreCancerous Actinic Keratoses by method of PhotoDynamic Therapy (PDT) and/or prescription Topical Chemotherapy agents such as 5-fluorouracil, 5-fluorouracil/calcipotriene, and/or imiquimod.  The purpose is to decrease the number of clinically evident and subclinical PreCancerous lesions to prevent progression to development of skin cancer by chemically destroying early precancer changes that may or may not be visible.  It has been shown to reduce the risk of developing skin cancer in the treated area. As a result of treatment, redness, scaling, crusting, and open sores may occur during treatment course. One or more than one of these methods may be used and may have to be used several times to control, suppress and eliminate the PreCancerous changes. Discussed treatment course, expected reaction, and possible side effects. - Recommend daily broad spectrum sunscreen SPF 30+ to sun-exposed areas, reapply every 2 hours as needed.  - Staying in the shade or wearing long sleeves, sun glasses (UVA+UVB protection) and wide brim hats (4-inch brim around the entire circumference of the hat) are also recommended. - Call  for new or changing lesions.  - Start 5FU mix BID to the nose 4-10 days until red and crusty (older prescription that he has at home).  History of Basal Cell Carcinoma of the Skin - No evidence of recurrence today - Recommend regular full body skin exams - Recommend daily broad spectrum sunscreen SPF 30+ to sun-exposed areas, reapply every 2 hours as needed.  - Call if any new or changing lesions are noted between office visits  History of Squamous Cell Carcinoma of the Skin - No evidence of recurrence today - No lymphadenopathy - Recommend regular full body skin exams - Recommend daily broad spectrum sunscreen SPF 30+ to sun-exposed areas, reapply every 2 hours as needed.  - Call if any new or changing lesions are noted between office visits  Skin cancer screening performed today.  Return in about 1 year (around 05/30/2022) for TBSE.  Luther Redo, CMA, am acting as scribe for Forest Gleason, MD .  Documentation: I have reviewed the above documentation for accuracy and completeness, and I agree with the above.  Forest Gleason, MD

## 2021-07-29 ENCOUNTER — Other Ambulatory Visit: Payer: Self-pay

## 2021-08-22 DIAGNOSIS — I10 Essential (primary) hypertension: Secondary | ICD-10-CM | POA: Diagnosis not present

## 2021-08-22 DIAGNOSIS — R509 Fever, unspecified: Secondary | ICD-10-CM | POA: Diagnosis not present

## 2021-08-22 DIAGNOSIS — R7302 Impaired glucose tolerance (oral): Secondary | ICD-10-CM | POA: Diagnosis not present

## 2021-08-22 DIAGNOSIS — E291 Testicular hypofunction: Secondary | ICD-10-CM | POA: Diagnosis not present

## 2021-08-22 DIAGNOSIS — D696 Thrombocytopenia, unspecified: Secondary | ICD-10-CM | POA: Diagnosis not present

## 2021-08-27 DIAGNOSIS — R7302 Impaired glucose tolerance (oral): Secondary | ICD-10-CM | POA: Diagnosis not present

## 2021-08-27 DIAGNOSIS — D696 Thrombocytopenia, unspecified: Secondary | ICD-10-CM | POA: Diagnosis not present

## 2021-08-29 ENCOUNTER — Other Ambulatory Visit: Payer: Self-pay

## 2021-08-30 ENCOUNTER — Other Ambulatory Visit: Payer: Self-pay

## 2021-08-30 DIAGNOSIS — M7741 Metatarsalgia, right foot: Secondary | ICD-10-CM | POA: Diagnosis not present

## 2021-08-30 DIAGNOSIS — D3610 Benign neoplasm of peripheral nerves and autonomic nervous system, unspecified: Secondary | ICD-10-CM | POA: Diagnosis not present

## 2021-08-30 DIAGNOSIS — M79672 Pain in left foot: Secondary | ICD-10-CM | POA: Diagnosis not present

## 2021-08-30 DIAGNOSIS — M79671 Pain in right foot: Secondary | ICD-10-CM | POA: Diagnosis not present

## 2021-08-30 DIAGNOSIS — M76811 Anterior tibial syndrome, right leg: Secondary | ICD-10-CM | POA: Diagnosis not present

## 2021-08-30 DIAGNOSIS — M19071 Primary osteoarthritis, right ankle and foot: Secondary | ICD-10-CM | POA: Diagnosis not present

## 2021-08-30 MED ORDER — METHYLPREDNISOLONE 4 MG PO TBPK
ORAL_TABLET | ORAL | 0 refills | Status: DC
  Filled 2021-08-30: qty 21, 6d supply, fill #0

## 2021-09-27 ENCOUNTER — Other Ambulatory Visit: Payer: Self-pay

## 2021-10-08 ENCOUNTER — Other Ambulatory Visit: Payer: Self-pay

## 2021-10-09 ENCOUNTER — Other Ambulatory Visit: Payer: Self-pay

## 2021-10-09 MED ORDER — TESTOSTERONE 20.25 MG/ACT (1.62%) TD GEL
TRANSDERMAL | 5 refills | Status: DC
  Filled 2021-10-09: qty 75, 30d supply, fill #0
  Filled 2021-11-12: qty 75, 30d supply, fill #1
  Filled 2022-02-14: qty 75, 30d supply, fill #2

## 2021-11-12 ENCOUNTER — Other Ambulatory Visit: Payer: Self-pay

## 2021-12-02 ENCOUNTER — Other Ambulatory Visit: Payer: Self-pay

## 2022-02-14 ENCOUNTER — Other Ambulatory Visit: Payer: Self-pay

## 2022-02-14 MED ORDER — LOSARTAN POTASSIUM 50 MG PO TABS
50.0000 mg | ORAL_TABLET | Freq: Every day | ORAL | 1 refills | Status: DC
  Filled 2022-02-14: qty 90, 90d supply, fill #0
  Filled 2022-06-17: qty 30, 30d supply, fill #1
  Filled 2022-07-28: qty 30, 30d supply, fill #2
  Filled 2022-09-01: qty 30, 30d supply, fill #3

## 2022-04-09 ENCOUNTER — Other Ambulatory Visit: Payer: Self-pay

## 2022-04-10 ENCOUNTER — Other Ambulatory Visit: Payer: Self-pay

## 2022-04-10 MED ORDER — TESTOSTERONE 20.25 MG/ACT (1.62%) TD GEL
TRANSDERMAL | 5 refills | Status: DC
  Filled 2022-04-10: qty 75, 30d supply, fill #0
  Filled 2022-06-17 – 2022-07-07 (×2): qty 75, 30d supply, fill #1

## 2022-04-11 ENCOUNTER — Other Ambulatory Visit: Payer: Self-pay

## 2022-04-23 DIAGNOSIS — Z125 Encounter for screening for malignant neoplasm of prostate: Secondary | ICD-10-CM | POA: Diagnosis not present

## 2022-04-23 DIAGNOSIS — Z0000 Encounter for general adult medical examination without abnormal findings: Secondary | ICD-10-CM | POA: Diagnosis not present

## 2022-04-30 DIAGNOSIS — I10 Essential (primary) hypertension: Secondary | ICD-10-CM | POA: Diagnosis not present

## 2022-04-30 DIAGNOSIS — R7302 Impaired glucose tolerance (oral): Secondary | ICD-10-CM | POA: Diagnosis not present

## 2022-04-30 DIAGNOSIS — Z1211 Encounter for screening for malignant neoplasm of colon: Secondary | ICD-10-CM | POA: Diagnosis not present

## 2022-04-30 DIAGNOSIS — D696 Thrombocytopenia, unspecified: Secondary | ICD-10-CM | POA: Diagnosis not present

## 2022-04-30 DIAGNOSIS — E291 Testicular hypofunction: Secondary | ICD-10-CM | POA: Diagnosis not present

## 2022-04-30 DIAGNOSIS — Z0000 Encounter for general adult medical examination without abnormal findings: Secondary | ICD-10-CM | POA: Diagnosis not present

## 2022-06-04 ENCOUNTER — Other Ambulatory Visit: Payer: Self-pay

## 2022-06-04 ENCOUNTER — Encounter: Payer: Self-pay | Admitting: Dermatology

## 2022-06-04 ENCOUNTER — Ambulatory Visit: Payer: 59 | Admitting: Dermatology

## 2022-06-04 DIAGNOSIS — B079 Viral wart, unspecified: Secondary | ICD-10-CM | POA: Diagnosis not present

## 2022-06-04 DIAGNOSIS — L814 Other melanin hyperpigmentation: Secondary | ICD-10-CM | POA: Diagnosis not present

## 2022-06-04 DIAGNOSIS — L820 Inflamed seborrheic keratosis: Secondary | ICD-10-CM

## 2022-06-04 DIAGNOSIS — D229 Melanocytic nevi, unspecified: Secondary | ICD-10-CM | POA: Diagnosis not present

## 2022-06-04 DIAGNOSIS — L821 Other seborrheic keratosis: Secondary | ICD-10-CM

## 2022-06-04 DIAGNOSIS — Z1283 Encounter for screening for malignant neoplasm of skin: Secondary | ICD-10-CM

## 2022-06-04 DIAGNOSIS — B351 Tinea unguium: Secondary | ICD-10-CM

## 2022-06-04 DIAGNOSIS — C4441 Basal cell carcinoma of skin of scalp and neck: Secondary | ICD-10-CM | POA: Diagnosis not present

## 2022-06-04 DIAGNOSIS — L578 Other skin changes due to chronic exposure to nonionizing radiation: Secondary | ICD-10-CM

## 2022-06-04 DIAGNOSIS — Z85828 Personal history of other malignant neoplasm of skin: Secondary | ICD-10-CM | POA: Diagnosis not present

## 2022-06-04 DIAGNOSIS — D492 Neoplasm of unspecified behavior of bone, soft tissue, and skin: Secondary | ICD-10-CM

## 2022-06-04 DIAGNOSIS — C4491 Basal cell carcinoma of skin, unspecified: Secondary | ICD-10-CM

## 2022-06-04 DIAGNOSIS — L570 Actinic keratosis: Secondary | ICD-10-CM

## 2022-06-04 HISTORY — DX: Basal cell carcinoma of skin, unspecified: C44.91

## 2022-06-04 MED ORDER — FLUARIX QUADRIVALENT 0.5 ML IM SUSY
PREFILLED_SYRINGE | INTRAMUSCULAR | 0 refills | Status: DC
  Filled 2022-06-04: qty 0.5, 1d supply, fill #0

## 2022-06-04 NOTE — Progress Notes (Signed)
Follow-Up Visit   Subjective  Edward Frederick is a 61 y.o. male who presents for the following: Annual Exam (Hx SCC, BCC, AK).  The patient presents for Total-Body Skin Exam (TBSE) for skin cancer screening and mole check.  The patient has spots, moles and lesions to be evaluated, some may be new or changing and the patient has concerns that these could be cancer.  Patient does have a few spots at face that get irritated with shaving and one at chest that is sticking out and irritated.   The following portions of the chart were reviewed this encounter and updated as appropriate:   Tobacco  Allergies  Meds  Problems  Med Hx  Surg Hx  Fam Hx      Review of Systems:  No other skin or systemic complaints except as noted in HPI or Assessment and Plan.  Objective  Well appearing patient in no apparent distress; mood and affect are within normal limits.  A full examination was performed including scalp, head, eyes, ears, nose, lips, neck, chest, axillae, abdomen, back, buttocks, bilateral upper extremities, bilateral lower extremities, hands, feet, fingers, toes, fingernails, and toenails. All findings within normal limits unless otherwise noted below.  left chest Verrucous papules -- Discussed viral etiology and contagion.   vertex scalp x 3 (3) Erythematous thin papules/macules with gritty scale.   right postauricular 0.4 cm pink and brown papule with pigment globules R/o pigmented BCC > Atypia     toenails Nails thickened with subungual debris  right temple x 12, left temple and preauricular x 5 (17) Erythematous stuck-on, waxy papule or plaque    Assessment & Plan  Filiform wart left chest  Discussed viral etiology and risk of spread.  Discussed multiple treatments may be required to clear warts.  Discussed possible post-treatment dyspigmentation and risk of recurrence.  Prior to procedure, discussed risks of blister formation, small wound, skin dyspigmentation,  or rare scar following cryotherapy. Recommend Vaseline ointment to treated areas while healing.   Destruction of lesion - left chest  Destruction method: cryotherapy   Informed consent: discussed and consent obtained   Lesion destroyed using liquid nitrogen: Yes   Cryotherapy cycles:  2 Outcome: patient tolerated procedure well with no complications   Post-procedure details: wound care instructions given    AK (actinic keratosis) (3) vertex scalp x 3  Actinic keratoses are precancerous spots that appear secondary to cumulative UV radiation exposure/sun exposure over time. They are chronic with expected duration over 1 year. A portion of actinic keratoses will progress to squamous cell carcinoma of the skin. It is not possible to reliably predict which spots will progress to skin cancer and so treatment is recommended to prevent development of skin cancer.  Recommend daily broad spectrum sunscreen SPF 30+ to sun-exposed areas, reapply every 2 hours as needed.  Recommend staying in the shade or wearing long sleeves, sun glasses (UVA+UVB protection) and wide brim hats (4-inch brim around the entire circumference of the hat). Call for new or changing lesions.  Prior to procedure, discussed risks of blister formation, small wound, skin dyspigmentation, or rare scar following cryotherapy. Recommend Vaseline ointment to treated areas while healing.   Destruction of lesion - vertex scalp x 3  Destruction method: cryotherapy   Informed consent: discussed and consent obtained   Lesion destroyed using liquid nitrogen: Yes   Cryotherapy cycles:  2 Outcome: patient tolerated procedure well with no complications   Post-procedure details: wound care instructions given  Neoplasm of skin right postauricular  Epidermal / dermal shaving  Lesion diameter (cm):  0.4 Informed consent: discussed and consent obtained   Timeout: patient name, date of birth, surgical site, and procedure verified    Anesthesia: the lesion was anesthetized in a standard fashion   Anesthetic:  1% lidocaine w/ epinephrine 1-100,000 local infiltration Instrument used: flexible razor blade   Hemostasis achieved with: aluminum chloride   Outcome: patient tolerated procedure well   Post-procedure details: wound care instructions given   Additional details:  Mupirocin and a bandage applied  Specimen 1 - Surgical pathology Differential Diagnosis: R/o pigmented BCC > Atypia  Check Margins: No 0.4 cm pink and brown papule with pigment globules   Onychomycosis toenails  Chronic and persistent condition with duration over one year. Condition is bothersome/symptomatic for patient. Currently flared. Has failed PO terbinafine.  Will submit nail clipping for PCR to [facility]  Consider po fluconazole/itraconazole vs Skin medicinals pending results  Discussed options  Related Medications terbinafine (LAMISIL) 250 MG tablet Take 1 by mouth for 7 days in [facility] June. Repeat at mid-August, mid October, and mid-December  Inflamed seborrheic keratosis (17) right temple x 12, left temple and preauricular x 5  Symptomatic, irritating, patient would like treated.  Prior to procedure, discussed risks of blister formation, small wound, skin dyspigmentation, or rare scar following cryotherapy. Recommend Vaseline ointment to treated areas while healing.   Destruction of lesion - right temple x 12, left temple and preauricular x 5  Destruction method: cryotherapy   Informed consent: discussed and consent obtained   Lesion destroyed using liquid nitrogen: Yes   Cryotherapy cycles:  2 Outcome: patient tolerated procedure well with no complications   Post-procedure details: wound care instructions given     History of Basal Cell Carcinoma of the Skin - No evidence of recurrence today - Recommend regular full body skin exams - Recommend daily broad spectrum sunscreen SPF 30+ to sun-exposed areas, reapply every 2 hours  as needed.  - Call if any new or changing lesions are noted between office visits  History of Squamous Cell Carcinoma of the Skin - No evidence of recurrence today - No lymphadenopathy - Recommend regular full body skin exams - Recommend daily broad spectrum sunscreen SPF 30+ to sun-exposed areas, reapply every 2 hours as needed.  - Call if any new or changing lesions are noted between office visits   Lentigines - Scattered tan macules - Due to sun exposure - Benign-appearing, observe - Recommend daily broad spectrum sunscreen SPF 30+ to sun-exposed areas, reapply every 2 hours as needed. - Call for any changes  Seborrheic Keratoses - Stuck-on, waxy, tan-brown papules and/or plaques  - Benign-appearing - Discussed benign etiology and prognosis. - Observe - Call for any changes  Melanocytic Nevi - Tan-brown and/or pink-flesh-colored symmetric macules and papules - Benign appearing on exam today - Observation - Call clinic for new or changing moles - Recommend daily use of broad spectrum spf 30+ sunscreen to sun-exposed areas.   Hemangiomas - Red papules - Discussed benign nature - Observe - Call for any changes  Actinic Damage with PreCancerous Actinic Keratoses Counseling for Topical Chemotherapy Management: Patient exhibits: - Severe, confluent actinic changes with pre-cancerous actinic keratoses that is secondary to cumulative UV radiation exposure over time - Condition that is severe; chronic, not at goal. - diffuse scaly erythematous macules and papules with underlying dyspigmentation - Discussed Prescription "Field Treatment" topical Chemotherapy for Severe, Chronic Confluent Actinic Changes with Pre-Cancerous Actinic Keratoses  Field treatment involves treatment of an entire area of skin that has confluent Actinic Changes (Sun/ Ultraviolet light damage) and PreCancerous Actinic Keratoses by method of PhotoDynamic Therapy (PDT) and/or prescription Topical Chemotherapy  agents such as 5-fluorouracil, 5-fluorouracil/calcipotriene, and/or imiquimod.  The purpose is to decrease the number of clinically evident and subclinical PreCancerous lesions to prevent progression to development of skin cancer by chemically destroying early precancer changes that may or may not be visible.  It has been shown to reduce the risk of developing skin cancer in the treated area. As a result of treatment, redness, scaling, crusting, and open sores may occur during treatment course. One or more than one of these methods may be used and may have to be used several times to control, suppress and eliminate the PreCancerous changes. Discussed treatment course, expected reaction, and possible side effects. - Recommend daily broad spectrum sunscreen SPF 30+ to sun-exposed areas, reapply every 2 hours as needed.  - Staying in the shade or wearing long sleeves, sun glasses (UVA+UVB protection) and wide brim hats (4-inch brim around the entire circumference of the hat) are also recommended. - Call for new or changing lesions. - Start 5-fluorouracil cream once a day for up to 2 weeks to affected areas including top of nose, left side of nose, right lower eyelid.  Reviewed course of treatment and expected reaction.  Patient advised to expect inflammation and crusting and advised that erosions are possible.  Patient advised to be diligent with sun protection during and after treatment. Handout with details of how to apply medication and what to expect provided. Counseled to keep medication out of reach of children and pets.  Skin cancer screening performed today.   Return in about 1 year (around 06/05/2023) for TBSE, Hx SCCis, Hx BCC.  Graciella Belton, RMA, am acting as scribe for Forest Gleason, MD .  Documentation: I have reviewed the above documentation for accuracy and completeness, and I agree with the above.  Forest Gleason, MD

## 2022-06-04 NOTE — Patient Instructions (Addendum)
Cryotherapy Aftercare  Wash gently with soap and water everyday.   Apply Vaseline and Band-Aid daily until healed.   - Start 5-fluorouracil cream once a day for up to 2 weeks to affected areas including top of nose, left side of nose, right lower eyelid.  Reviewed course of treatment and expected reaction.  Patient advised to expect inflammation and crusting and advised that erosions are possible.  Patient advised to be diligent with sun protection during and after treatment. Handout with details of how to apply medication and what to expect provided. Counseled to keep medication out of reach of children and pets.  Wound Care Instructions  Cleanse wound gently with soap and water once a day then pat dry with clean gauze. Apply a thin coat of Petrolatum (petroleum jelly, "Vaseline") over the wound (unless you have an allergy to this). We recommend that you use a new, sterile tube of Vaseline. Do not pick or remove scabs. Do not remove the yellow or white "healing tissue" from the base of the wound.  Cover the wound with fresh, clean, nonstick gauze and secure with paper tape. You may use Band-Aids in place of gauze and tape if the wound is small enough, but would recommend trimming much of the tape off as there is often too much. Sometimes Band-Aids can irritate the skin.  You should call the office for your biopsy report after 1 week if you have not already been contacted.  If you experience any problems, such as abnormal amounts of bleeding, swelling, significant bruising, significant pain, or evidence of infection, please call the office immediately.  FOR ADULT SURGERY PATIENTS: If you need something for pain relief you may take 1 extra strength Tylenol (acetaminophen) AND 2 Ibuprofen ('200mg'$  each) together every 4 hours as needed for pain. (do not take these if you are allergic to them or if you have a reason you should not take them.) Typically, you may only need pain medication for 1 to 3 days.    Recommend taking Heliocare sun protection supplement daily in sunny weather for additional sun protection. For maximum protection on the sunniest days, you can take up to 2 capsules of regular Heliocare OR take 1 capsule of Heliocare Ultra. For prolonged exposure (such as a full day in the sun), you can repeat your dose of the supplement 4 hours after your first dose. Heliocare can be purchased at [employer], at some [facility] or at [email or web address].    Melanoma ABCDEs  Melanoma is the most dangerous type of skin cancer, and is the leading cause of death from skin disease.  You are more likely to develop melanoma if you: Have light-colored skin, light-colored eyes, or red or blond hair Spend a lot of time in the sun Tan regularly, either outdoors or in a tanning bed Have had blistering sunburns, especially during childhood Have a close family member who has had a melanoma Have atypical moles or large birthmarks  Early detection of melanoma is key since treatment is typically straightforward and cure rates are extremely high if we catch it early.   The first sign of melanoma is often a change in a mole or a new dark spot.  The ABCDE system is a way of remembering the signs of melanoma.  A for asymmetry:  The two halves do not match. B for border:  The edges of the growth are irregular. C for color:  A mixture of colors are present instead of an even brown color. D  for diameter:  Melanomas are usually (but not always) greater than 36m - the size of a pencil eraser. E for evolution:  The spot keeps changing in size, shape, and color.  Please check your skin once per month between visits. You can use a small mirror in front and a large mirror behind you to keep an eye on the back side or your body.   If you see any new or changing lesions before your next follow-up, please call to schedule a visit.  Please continue daily skin protection including broad spectrum sunscreen SPF 30+  to sun-exposed areas, reapplying every 2 hours as needed when you're outdoors.    Due to recent changes in healthcare laws, you may see results of your pathology and/or laboratory studies on MyChart before the doctors have had a chance to review them. We understand that in some cases there may be results that are confusing or concerning to you. Please understand that not all results are received at the same time and often the doctors may need to interpret multiple results in order to provide you with the best plan of care or course of treatment. Therefore, we ask that you please give u[place]2 business days to thoroughly review all your results before contacting the office for clarification. Should we see a critical lab result, you will be contacted sooner.   If You Need Anything After Your Visit  If you have any questions or concerns for your doctor, please call our main line at [phone] press option 4 to reach your doctor's medical assistant. If no one answers, please leave a voicemail as directed and we will return your call as soon as possible. Messages left after 4 pm will be answered the following business day.   You may also send [place] message via [place] We typically respond to MyChart messages within 1-2 business days.  For prescription refills, please ask your pharmacy to contact our office. Our fax number is [phone]  If you have an urgent issue when the clinic is closed that cannot wait until the next business day, you can page your doctor at the number below.    Please note that while we do our best to be available for urgent issues outside of office hours, we are not available 24/7.   If you have an urgent issue and are unable to reach u[place] you may choose to seek medical care at your doctor's office, retail clinic, urgent care center, or emergency room.  If you have a medical emergency, please immediately call 911 or go to the emergency department.  Pager Numbers  - Dr.  KNehemiah Massed [phone] - Dr. MLaurence Ferrari [phone] - Dr. SNicole Kindred [phone] In the event of inclement weather, please call our main line at [phone] an update on the status of any delays or closures.  Dermatology Medication Tips: Please keep the boxes that topical medications come in in order to help keep track of the instructions about where and how to use these. Pharmacies typically print the medication instructions only on the boxes and not directly on the medication tubes.   If your medication is too expensive, please contact our office at [phone] 4 or send [place] message through [place]   We are unable to tell what your co-pay for medications will be in advance as this is different depending on your insurance coverage. However, we may be able to find a substitute medication at lower cost or fill out paperwork  to get insurance to cover a needed medication.   If a prior authorization is required to get your medication covered by your insurance company, please allow [place] 1-2 business days to complete this process.  Drug prices often vary depending on where the prescription is filled and some pharmacies may offer cheaper prices.  The website www.goodrx.com contains coupons for medications through different pharmacies. The prices here do not account for what the cost may be with help from insurance (it may be cheaper with your insurance), but the website can give you the price if you did not use any insurance.  - You can print the associated coupon and take it with your prescription to the pharmacy.  - You may also stop by our office during regular business hours and pick up a GoodRx coupon card.  - If you need your prescription sent electronically to a different pharmacy, notify our office through [facility] or by phone at [phone] option 4.     Si Usted Necesita Algo Despus de Su Visita  Tambin puede enviarnos un mensaje a travs de Pharmacist, community. Por lo  general respondemos a los mensajes de MyChart en el transcurso de 1 a 2 das hbiles.  Para renovar recetas, por favor pida a su farmacia que se ponga en contacto con nuestra oficina. Harland Dingwall de fax es [place] [phone].  Si tiene un asunto urgente cuando la clnica est cerrada y que no puede esperar hasta el siguiente da hbil, puede llamar/localizar a su doctor(a) al nmero que aparece a continuacin.   Por favor, tenga en cuenta que aunque hacemos todo lo posible para estar disponibles para asuntos urgentes fuera del horario de [place], no estamos disponibles las 24 horas del da, los 7 das de la [place].   Si tiene un problema urgente y no puede comunicarse con nosotros, puede optar por buscar atencin mdica  en el consultorio de su doctor(a), en una clnica privada, en un centro de atencin urgente o en una sala de emergencias.  Si tiene Engineering geologist, por favor llame inmediatamente al 911 o vaya a la sala de emergencias.  Nmeros de bper  - Dr. Nehemiah Massed: [phone]  - Dra. Moye: [phone]  - Dra. Nicole Kindred: [phone]  En caso de inclemencias del [place], por favor llame a Johnsie Kindred principal al [phone] para una actualizacin sobre el [place] de cualquier retraso o cierre.  Consejos para la medicacin en dermatologa: Por favor, guarde las cajas en las que vienen los medicamentos de uso tpico para ayudarle a seguir las instrucciones sobre dnde y cmo usarlos. Las farmacias generalmente imprimen las instrucciones del medicamento slo en las cajas y no directamente en los tubos del [place].   Si su medicamento es muy caro, por favor, pngase en contacto con Zigmund Daniel llamando al [phone] y presione la opcin 4 o envenos un mensaje a travs de Pharmacist, community.   No podemos decirle cul ser su copago por los medicamentos por adelantado ya que esto es diferente dependiendo de la cobertura de su seguro. Sin embargo, es posible que podamos encontrar un  medicamento sustituto a Electrical engineer un formulario para que el seguro cubra el medicamento que se considera necesario.   Si se requiere una autorizacin previa para que su compaa de seguros [place] su medicamento, por favor permtanos de 1 a 2 das hbiles para completar este proceso.  Los precios de los medicamentos varan con frecuencia dependiendo del Environmental consultant de dnde se surte la receta y [place]  pueden ofrecer precios ms baratos.  El sitio web www.goodrx.com tiene cupones para medicamentos de Airline pilot. Los precios aqu no tienen en cuenta lo que podra costar con la ayuda del seguro (puede ser ms barato con su seguro), pero el sitio web puede darle el precio si no utiliz Research scientist (physical sciences).  - Puede imprimir el cupn correspondiente y llevarlo con su receta a la farmacia.  - Tambin puede pasar por nuestra oficina durante el horario de atencin regular y Charity fundraiser una tarjeta de cupones de GoodRx.  - Si necesita que su receta se enve electrnicamente a una farmacia diferente, informe a nuestra oficina a travs de MyChart de [place] o por telfono llamando al [phone] y presione la opcin 4.

## 2022-06-09 ENCOUNTER — Encounter: Payer: Self-pay | Admitting: Dermatology

## 2022-06-10 ENCOUNTER — Telehealth: Payer: Self-pay

## 2022-06-10 NOTE — Telephone Encounter (Addendum)
  Tried calling patient regarding bx results and need for treatment scheduled. No answer. LMOM for patient to call office.     ----- Message from Alfonso Patten, MD sent at 06/09/2022 11:09 PM EDT ----- Skin , right postauricular BASAL CELL CARCINOMA, NODULAR PATTERN, PIGMENTED --> Given small size suggest ED&C > excision  ED&C has about an 85% cure rate and leaves a round wound the size of the skin cancer which is healed with ointment and a bandage over a few weeks time. It leaves a round white scar. No additional pathology is done. If the skin cancer were to come back, we would need to do a surgery to remove it.   Excision involves cutting out the spot with an area of normal looking skin around it followed by closing it with stitches. The cure rate is approximately 92-93%. It leaves a line scar, and you must take it easy for two weeks after surgery (no lifting over 10-15 lbs, avoid activity to get your heart rate and blood pressure up). There is a slightly higher risk of infection, bleeding or the wound opening up with this compared to the scrape and burn.   MAs please call and schedule for follow-up. Let me know if he wishes to discuss this with me before scheduling. Thank you!

## 2022-06-12 ENCOUNTER — Telehealth: Payer: Self-pay

## 2022-06-12 ENCOUNTER — Ambulatory Visit: Payer: 59 | Admitting: Dermatology

## 2022-06-12 NOTE — Telephone Encounter (Signed)
Called pt discussed biopsy results pt scheduled for [facility]

## 2022-06-16 ENCOUNTER — Telehealth: Payer: Self-pay

## 2022-06-16 NOTE — Telephone Encounter (Signed)
Results scanned in under media tab from [facility] for your review.

## 2022-06-17 ENCOUNTER — Ambulatory Visit: Payer: 59 | Admitting: Dermatology

## 2022-06-17 ENCOUNTER — Other Ambulatory Visit: Payer: Self-pay

## 2022-06-17 ENCOUNTER — Other Ambulatory Visit: Payer: Self-pay | Admitting: Dermatology

## 2022-06-17 ENCOUNTER — Encounter: Payer: Self-pay | Admitting: Dermatology

## 2022-06-17 DIAGNOSIS — L578 Other skin changes due to chronic exposure to nonionizing radiation: Secondary | ICD-10-CM | POA: Diagnosis not present

## 2022-06-17 DIAGNOSIS — L570 Actinic keratosis: Secondary | ICD-10-CM | POA: Diagnosis not present

## 2022-06-17 DIAGNOSIS — C4441 Basal cell carcinoma of skin of scalp and neck: Secondary | ICD-10-CM

## 2022-06-17 DIAGNOSIS — C44319 Basal cell carcinoma of skin of other parts of face: Secondary | ICD-10-CM

## 2022-06-17 NOTE — Progress Notes (Deleted)
   Follow-Up Visit   Subjective  Edward Frederick is a 61 y.o. male who presents for the following: Follow-up (Here for treatment of biopsy proven BCC at left post auricular ).  ***  The following portions of the chart were reviewed this encounter and updated as appropriate:       Review of Systems:  No other skin or systemic complaints except as noted in HPI or Assessment and Plan.  Objective  Well appearing patient in no apparent distress; mood and affect are within normal limits.  {[id number]::"Y full examination was performed including scalp, head, eyes, ears, nose, lips, neck, chest, axillae, abdomen, back, buttocks, bilateral upper extremities, bilateral lower extremities, hands, feet, fingers, toes, fingernails, and toenails. All findings within normal limits unless otherwise noted below."}  right post auricular Pink pearly papule or plaque with arborizing vessels.     Assessment & Plan  Basal cell carcinoma (BCC) of skin of other part of face right post auricular  Destruction of lesion  Destruction method: electrodesiccation and curettage   Informed consent: discussed and consent obtained   Timeout:  patient name, date of birth, surgical site, and procedure verified Anesthesia: the lesion was anesthetized in a standard fashion   Anesthetic:  1% lidocaine w/ epinephrine 1-100,000 buffered w/ 8.4% NaHCO3 Curettage performed in three different directions: Yes   Electrodesiccation performed over the curetted area: Yes   Curettage cycles:  3 Hemostasis achieved with:  electrodesiccation Outcome: patient tolerated procedure well with no complications   Post-procedure details: sterile dressing applied and wound care instructions given   Dressing type: petrolatum     No follow-ups on file.    Follow-Up Visit   Subjective  Edward Frederick is a 61 y.o. male who presents for the following: Follow-up (Here for treatment of biopsy proven BCC at left post auricular  ).  ***  The following portions of the chart were reviewed this encounter and updated as appropriate:       Review of Systems:  No other skin or systemic complaints except as noted in HPI or Assessment and Plan.  Objective  Well appearing patient in no apparent distress; mood and affect are within normal limits.  {[id number]::"D full examination was performed including scalp, head, eyes, ears, nose, lips, neck, chest, axillae, abdomen, back, buttocks, bilateral upper extremities, bilateral lower extremities, hands, feet, fingers, toes, fingernails, and toenails. All findings within normal limits unless otherwise noted below."}  right post auricular Pink pearly papule or plaque with arborizing vessels.     Assessment & Plan  Basal cell carcinoma (BCC) of skin of other part of face right post auricular  Destruction of lesion  Destruction method: electrodesiccation and curettage   Informed consent: discussed and consent obtained   Timeout:  patient name, date of birth, surgical site, and procedure verified Anesthesia: the lesion was anesthetized in a standard fashion   Anesthetic:  1% lidocaine w/ epinephrine 1-100,000 buffered w/ 8.4% NaHCO3 Curettage performed in three different directions: Yes   Electrodesiccation performed over the curetted area: Yes   Curettage cycles:  3 Hemostasis achieved with:  electrodesiccation Outcome: patient tolerated procedure well with no complications   Post-procedure details: sterile dressing applied and wound care instructions given   Dressing type: petrolatum     No follow-ups on file.

## 2022-06-17 NOTE — Telephone Encounter (Signed)
PCR showed T rubrum and T interdigitale.  Spoke with patient.   MAs please call [facility] and see if they can do susceptibility testing. If so, we would like to order that. If not, will order CMP and pending normal CMP, plan itraconazole 200 mg daily for 12 weeks. Would send in 1 months supply and refill and plan repeat CMP in 6 weeks.

## 2022-06-17 NOTE — Patient Instructions (Signed)
Due to recent changes in healthcare laws, you may see results of your pathology and/or laboratory studies on MyChart before the doctors have had a chance to review them. We understand that in some cases there may be results that are confusing or concerning to you. Please understand that not all results are received at the same time and often the doctors may need to interpret multiple results in order to provide you with the best plan of care or course of treatment. Therefore, we ask that you please give us 2 business days to thoroughly review all your results before contacting the office for clarification. Should we see a critical lab result, you will be contacted sooner.   If You Need Anything After Your Visit  If you have any questions or concerns for your doctor, please call our main line at [phone] and press option 4 to reach your doctor's medical assistant. If no one answers, please leave a voicemail as directed and we will return your call as soon as possible. Messages left after 4 pm will be answered the following business day.   You may also send us a message via MyChart. We typically respond to MyChart messages within 1-2 business days.  For prescription refills, please ask your pharmacy to contact our office. Our fax number is [phone].  If you have an urgent issue when the clinic is closed that cannot wait until the next business day, you can page your doctor at the number below.    Please note that while we do our best to be available for urgent issues outside of office hours, we are not available 24/7.   If you have an urgent issue and are unable to reach us, you may choose to seek medical care at your doctor's office, retail clinic, urgent care center, or emergency room.  If you have a medical emergency, please immediately call 911 or go to the emergency department.  Pager Numbers  - Dr. Kowalski: [phone]  - Dr. Moye: [phone]  - Dr. Stewart:  [phone]  In the event of inclement weather, please call our main line at [phone] for an update on the status of any delays or closures.  Dermatology Medication Tips: Please keep the boxes that topical medications come in in order to help keep track of the instructions about where and how to use these. Pharmacies typically print the medication instructions only on the boxes and not directly on the medication tubes.   If your medication is too expensive, please contact our office at [phone] option 4 or send us a message through MyChart.   We are unable to tell what your co-pay for medications will be in advance as this is different depending on your insurance coverage. However, we may be able to find a substitute medication at lower cost or fill out paperwork to get insurance to cover a needed medication.   If a prior authorization is required to get your medication covered by your insurance company, please allow us 1-2 business days to complete this process.  Drug prices often vary depending on where the prescription is filled and some pharmacies may offer cheaper prices.  The website www.goodrx.com contains coupons for medications through different pharmacies. The prices here do not account for what the cost may be with help from insurance (it may be cheaper with your insurance), but the website can give you the price if you did not use any insurance.  - You can print the associated coupon and take it with   your prescription to the pharmacy.  - You may also stop by our office during regular business hours and pick up a GoodRx coupon card.  - If you need your prescription sent electronically to a different pharmacy, notify our office through [facility] MyChart or by phone at [phone] option 4.     Si Usted Necesita Algo Despus de Su Visita  Tambin puede enviarnos un mensaje a travs de MyChart. Por lo general respondemos a los mensajes de MyChart en el transcurso de 1 a 2  das hbiles.  Para renovar recetas, por favor pida a su farmacia que se ponga en contacto con nuestra oficina. Nuestro nmero de fax es el [phone].  Si tiene un asunto urgente cuando la clnica est cerrada y que no puede esperar hasta el siguiente da hbil, puede llamar/localizar a su doctor(a) al nmero que aparece a continuacin.   Por favor, tenga en cuenta que aunque hacemos todo lo posible para estar disponibles para asuntos urgentes fuera del horario de oficina, no estamos disponibles las 24 horas del da, los 7 das de la semana.   Si tiene un problema urgente y no puede comunicarse con nosotros, puede optar por buscar atencin mdica  en el consultorio de su doctor(a), en una clnica privada, en un centro de atencin urgente o en una sala de emergencias.  Si tiene una emergencia mdica, por favor llame inmediatamente al 911 o vaya a la sala de emergencias.  Nmeros de bper  - Dr. Kowalski: [phone]  - Dra. Moye: [phone]  - Dra. Stewart: [phone]  En caso de inclemencias del tiempo, por favor llame a nuestra lnea principal al [phone] para una actualizacin sobre el estado de cualquier retraso o cierre.  Consejos para la medicacin en dermatologa: Por favor, guarde las cajas en las que vienen los medicamentos de uso tpico para ayudarle a seguir las instrucciones sobre dnde y cmo usarlos. Las farmacias generalmente imprimen las instrucciones del medicamento slo en las cajas y no directamente en los tubos del medicamento.   Si su medicamento es muy caro, por favor, pngase en contacto con nuestra oficina llamando al [phone] y presione la opcin 4 o envenos un mensaje a travs de MyChart.   No podemos decirle cul ser su copago por los medicamentos por adelantado ya que esto es diferente dependiendo de la cobertura de su seguro. Sin embargo, es posible que podamos encontrar un medicamento sustituto a menor costo o llenar un formulario para que el  seguro cubra el medicamento que se considera necesario.   Si se requiere una autorizacin previa para que su compaa de seguros cubra su medicamento, por favor permtanos de 1 a 2 das hbiles para completar este proceso.  Los precios de los medicamentos varan con frecuencia dependiendo del lugar de dnde se surte la receta y alguna farmacias pueden ofrecer precios ms baratos.  El sitio web www.goodrx.com tiene cupones para medicamentos de diferentes farmacias. Los precios aqu no tienen en cuenta lo que podra costar con la ayuda del seguro (puede ser ms barato con su seguro), pero el sitio web puede darle el precio si no utiliz ningn seguro.  - Puede imprimir el cupn correspondiente y llevarlo con su receta a la farmacia.  - Tambin puede pasar por nuestra oficina durante el horario de atencin regular y recoger una tarjeta de cupones de GoodRx.  - Si necesita que su receta se enve electrnicamente a una farmacia diferente, informe a nuestra oficina a travs de MyChart de [facility]   o por telfono llamando al [phone] y presione la opcin 4.      Due to recent changes in healthcare laws, you may see results of your pathology and/or laboratory studies on MyChart before the doctors have had a chance to review them. We understand that in some cases there may be results that are confusing or concerning to you. Please understand that not all results are received at the same time and often the doctors may need to interpret multiple results in order to provide you with the best plan of care or course of treatment. Therefore, we ask that you please give us 2 business days to thoroughly review all your results before contacting the office for clarification. Should we see a critical lab result, you will be contacted sooner.   If You Need Anything After Your Visit  If you have any questions or concerns for your doctor, please call our main line at [phone] and press option 4 to reach your  doctor's medical assistant. If no one answers, please leave a voicemail as directed and we will return your call as soon as possible. Messages left after 4 pm will be answered the following business day.   You may also send us a message via MyChart. We typically respond to MyChart messages within 1-2 business days.  For prescription refills, please ask your pharmacy to contact our office. Our fax number is [phone].  If you have an urgent issue when the clinic is closed that cannot wait until the next business day, you can page your doctor at the number below.    Please note that while we do our best to be available for urgent issues outside of office hours, we are not available 24/7.   If you have an urgent issue and are unable to reach us, you may choose to seek medical care at your doctor's office, retail clinic, urgent care center, or emergency room.  If you have a medical emergency, please immediately call 911 or go to the emergency department.  Pager Numbers  - Dr. Kowalski: [phone]  - Dr. Moye: [phone]  - Dr. Stewart: [phone]  In the event of inclement weather, please call our main line at [phone] for an update on the status of any delays or closures.  Dermatology Medication Tips: Please keep the boxes that topical medications come in in order to help keep track of the instructions about where and how to use these. Pharmacies typically print the medication instructions only on the boxes and not directly on the medication tubes.   If your medication is too expensive, please contact our office at [phone] option 4 or send us a message through MyChart.   We are unable to tell what your co-pay for medications will be in advance as this is different depending on your insurance coverage. However, we may be able to find a substitute medication at lower cost or fill out paperwork to get insurance to cover a needed medication.   If a prior authorization is  required to get your medication covered by your insurance company, please allow us 1-2 business days to complete this process.  Drug prices often vary depending on where the prescription is filled and some pharmacies may offer cheaper prices.  The website www.goodrx.com contains coupons for medications through different pharmacies. The prices here do not account for what the cost may be with help from insurance (it may be cheaper with your insurance), but the website can give you the price if you   did not use any insurance.  - You can print the associated coupon and take it with your prescription to the pharmacy.  - You may also stop by our office during regular business hours and pick up a GoodRx coupon card.  - If you need your prescription sent electronically to a different pharmacy, notify our office through [facility] MyChart or by phone at [phone] option 4.     Si Usted Necesita Algo Despus de Su Visita  Tambin puede enviarnos un mensaje a travs de MyChart. Por lo general respondemos a los mensajes de MyChart en el transcurso de 1 a 2 das hbiles.  Para renovar recetas, por favor pida a su farmacia que se ponga en contacto con nuestra oficina. Nuestro nmero de fax es el [phone].  Si tiene un asunto urgente cuando la clnica est cerrada y que no puede esperar hasta el siguiente da hbil, puede llamar/localizar a su doctor(a) al nmero que aparece a continuacin.   Por favor, tenga en cuenta que aunque hacemos todo lo posible para estar disponibles para asuntos urgentes fuera del horario de oficina, no estamos disponibles las 24 horas del da, los 7 das de la semana.   Si tiene un problema urgente y no puede comunicarse con nosotros, puede optar por buscar atencin mdica  en el consultorio de su doctor(a), en una clnica privada, en un centro de atencin urgente o en una sala de emergencias.  Si tiene una emergencia mdica, por favor llame inmediatamente al 911 o vaya a  la sala de emergencias.  Nmeros de bper  - Dr. Kowalski: [phone]  - Dra. Moye: [phone]  - Dra. Stewart: [phone]  En caso de inclemencias del tiempo, por favor llame a nuestra lnea principal al [phone] para una actualizacin sobre el estado de cualquier retraso o cierre.  Consejos para la medicacin en dermatologa: Por favor, guarde las cajas en las que vienen los medicamentos de uso tpico para ayudarle a seguir las instrucciones sobre dnde y cmo usarlos. Las farmacias generalmente imprimen las instrucciones del medicamento slo en las cajas y no directamente en los tubos del medicamento.   Si su medicamento es muy caro, por favor, pngase en contacto con nuestra oficina llamando al [phone] y presione la opcin 4 o envenos un mensaje a travs de MyChart.   No podemos decirle cul ser su copago por los medicamentos por adelantado ya que esto es diferente dependiendo de la cobertura de su seguro. Sin embargo, es posible que podamos encontrar un medicamento sustituto a menor costo o llenar un formulario para que el seguro cubra el medicamento que se considera necesario.   Si se requiere una autorizacin previa para que su compaa de seguros cubra su medicamento, por favor permtanos de 1 a 2 das hbiles para completar este proceso.  Los precios de los medicamentos varan con frecuencia dependiendo del lugar de dnde se surte la receta y alguna farmacias pueden ofrecer precios ms baratos.  El sitio web www.goodrx.com tiene cupones para medicamentos de diferentes farmacias. Los precios aqu no tienen en cuenta lo que podra costar con la ayuda del seguro (puede ser ms barato con su seguro), pero el sitio web puede darle el precio si no utiliz ningn seguro.  - Puede imprimir el cupn correspondiente y llevarlo con su receta a la farmacia.  - Tambin puede pasar por nuestra oficina durante el horario de atencin regular y recoger una tarjeta de cupones de  GoodRx.  - Si necesita que su receta se   enve electrnicamente a una farmacia diferente, informe a nuestra oficina a travs de MyChart de [facility] o por telfono llamando al [phone] y presione la opcin 4.  

## 2022-06-17 NOTE — Progress Notes (Signed)
Follow-Up Visit   Subjective  Edward Frederick is a 61 y.o. male who presents for the following: Follow-up (Here for treatment of biopsy proven BCC at right post auricular ).  Recheck areas on his face treating with 5FU cream.    The following portions of the chart were reviewed this encounter and updated as appropriate:   Tobacco  Allergies  Meds  Problems  Med Hx  Surg Hx  Fam Hx      Review of Systems:  No other skin or systemic complaints except as noted in HPI or Assessment and Plan.  Objective  Well appearing patient in no apparent distress; mood and affect are within normal limits.  A focused examination was performed including right post auricular, face. Relevant physical exam findings are noted in the Assessment and Plan.  right post auricular Pink papule    Assessment & Plan  Basal cell carcinoma (BCC) of skin of other part of face right post auricular  Destruction of lesion  Destruction method: electrodesiccation and curettage   Informed consent: discussed and consent obtained   Timeout:  patient name, date of birth, surgical site, and procedure verified Anesthesia: the lesion was anesthetized in a standard fashion   Anesthetic:  1% lidocaine w/ epinephrine 1-100,000 buffered w/ 8.4% NaHCO3 Curettage performed in three different directions: Yes   Electrodesiccation performed over the curetted area: Yes   Curettage cycles:  3 Final wound size (cm):  1.2 Hemostasis achieved with:  electrodesiccation Outcome: patient tolerated procedure well with no complications   Post-procedure details: sterile dressing applied and wound care instructions given   Dressing type: petrolatum    ED&C has about an 85% cure rate and leaves a round wound the size of the skin cancer which is healed with ointment and a bandage over a few weeks time. It leaves a round white scar. No additional pathology is done. If the skin cancer were to come back, we would need to do a surgery to  remove it.   Excision involves cutting out the spot with an area of normal looking skin around it followed by closing it with stitches. The cure rate is approximately 92-93%. It leaves a line scar, and you must take it easy for two weeks after surgery (no lifting over 10-15 lbs, avoid activity to get your heart rate and blood pressure up). There is a slightly higher risk of infection, bleeding or the wound opening up with this compared to the scrape and burn.  Pt opts for [facility]  Actinic Damage with PreCancerous Actinic Keratoses Counseling for Topical Chemotherapy Management: Patient exhibits: - Severe, confluent actinic changes with pre-cancerous actinic keratoses that is secondary to cumulative UV radiation exposure over time - Condition that is severe; chronic, not at goal. - diffuse scaly erythematous macules and papules with underlying dyspigmentation - Discussed Prescription "Field Treatment" topical Chemotherapy for Severe, Chronic Confluent Actinic Changes with Pre-Cancerous Actinic Keratoses  Continue  5-fluorouracil cream once a day for up to 1 week to affected areas including top of nose, left side of nose,  D/C 5-Fluorouracil on the right lower eyelid. Reviewed course of treatment and expected reaction.  Patient advised to expect inflammation and crusting and advised that erosions are possible.  Patient advised to be diligent with sun protection during and after treatment. Handout with details of how to apply medication and what to expect provided. Counseled to keep medication out of reach of children and pets.  Field treatment involves treatment of an entire area  of skin that has confluent Actinic Changes (Sun/ Ultraviolet light damage) and PreCancerous Actinic Keratoses by method of PhotoDynamic Therapy (PDT) and/or prescription Topical Chemotherapy agents such as 5-fluorouracil, 5-fluorouracil/calcipotriene, and/or imiquimod.  The purpose is to decrease the number of clinically evident  and subclinical PreCancerous lesions to prevent progression to development of skin cancer by chemically destroying early precancer changes that may or may not be visible.  It has been shown to reduce the risk of developing skin cancer in the treated area. As a result of treatment, redness, scaling, crusting, and open sores may occur during treatment course. One or more than one of these methods may be used and may have to be used several times to control, suppress and eliminate the PreCancerous changes. Discussed treatment course, expected reaction, and possible side effects. - Recommend daily broad spectrum sunscreen SPF 30+ to sun-exposed areas, reapply every 2 hours as needed.  - Staying in the shade or wearing long sleeves, sun glasses (UVA+UVB protection) and wide brim hats (4-inch brim around the entire circumference of the hat) are also recommended. - Call for new or changing lesions.    Return in about 6 months (around 12/16/2022) for TBSE, hx of BCC .  I, Marye Round, CMA, am acting as scribe for Forest Gleason, MD .   Documentation: I have reviewed the above documentation for accuracy and completeness, and I agree with the above.  Forest Gleason, MD

## 2022-06-21 ENCOUNTER — Encounter: Payer: Self-pay | Admitting: Dermatology

## 2022-06-23 ENCOUNTER — Other Ambulatory Visit: Payer: Self-pay

## 2022-06-24 NOTE — Telephone Encounter (Signed)
Any update from [facility] about susceptibility testing? Thank you

## 2022-06-25 DIAGNOSIS — M5451 Vertebrogenic low back pain: Secondary | ICD-10-CM | POA: Diagnosis not present

## 2022-06-25 DIAGNOSIS — M25511 Pain in right shoulder: Secondary | ICD-10-CM | POA: Diagnosis not present

## 2022-06-25 DIAGNOSIS — M19011 Primary osteoarthritis, right shoulder: Secondary | ICD-10-CM | POA: Diagnosis not present

## 2022-06-25 DIAGNOSIS — M7918 Myalgia, other site: Secondary | ICD-10-CM | POA: Diagnosis not present

## 2022-06-25 DIAGNOSIS — M5136 Other intervertebral disc degeneration, lumbar region: Secondary | ICD-10-CM | POA: Diagnosis not present

## 2022-06-25 DIAGNOSIS — M5137 Other intervertebral disc degeneration, lumbosacral region: Secondary | ICD-10-CM | POA: Diagnosis not present

## 2022-06-25 DIAGNOSIS — M9903 Segmental and somatic dysfunction of lumbar region: Secondary | ICD-10-CM | POA: Diagnosis not present

## 2022-06-25 DIAGNOSIS — M9904 Segmental and somatic dysfunction of sacral region: Secondary | ICD-10-CM | POA: Diagnosis not present

## 2022-06-25 NOTE — Telephone Encounter (Signed)
Edward Frederick from [facility] said they do not add antibiotic testing unless bacteria is present. She said only fungus was detected in test.

## 2022-06-26 DIAGNOSIS — M19011 Primary osteoarthritis, right shoulder: Secondary | ICD-10-CM | POA: Diagnosis not present

## 2022-06-26 DIAGNOSIS — M9903 Segmental and somatic dysfunction of lumbar region: Secondary | ICD-10-CM | POA: Diagnosis not present

## 2022-06-26 DIAGNOSIS — M9904 Segmental and somatic dysfunction of sacral region: Secondary | ICD-10-CM | POA: Diagnosis not present

## 2022-06-26 DIAGNOSIS — M7918 Myalgia, other site: Secondary | ICD-10-CM | POA: Diagnosis not present

## 2022-06-26 DIAGNOSIS — M25511 Pain in right shoulder: Secondary | ICD-10-CM | POA: Diagnosis not present

## 2022-06-26 DIAGNOSIS — M5451 Vertebrogenic low back pain: Secondary | ICD-10-CM | POA: Diagnosis not present

## 2022-06-26 DIAGNOSIS — M5137 Other intervertebral disc degeneration, lumbosacral region: Secondary | ICD-10-CM | POA: Diagnosis not present

## 2022-06-26 DIAGNOSIS — M5136 Other intervertebral disc degeneration, lumbar region: Secondary | ICD-10-CM | POA: Diagnosis not present

## 2022-06-26 NOTE — Telephone Encounter (Signed)
Do they do susceptibility testing for antifungals? Thank you

## 2022-06-26 NOTE — Telephone Encounter (Signed)
Called [facility]. They will call back with answer.

## 2022-06-27 ENCOUNTER — Other Ambulatory Visit: Payer: Self-pay

## 2022-06-27 DIAGNOSIS — M19011 Primary osteoarthritis, right shoulder: Secondary | ICD-10-CM | POA: Diagnosis not present

## 2022-06-27 DIAGNOSIS — M9904 Segmental and somatic dysfunction of sacral region: Secondary | ICD-10-CM | POA: Diagnosis not present

## 2022-06-27 DIAGNOSIS — M9903 Segmental and somatic dysfunction of lumbar region: Secondary | ICD-10-CM | POA: Diagnosis not present

## 2022-06-27 DIAGNOSIS — M5451 Vertebrogenic low back pain: Secondary | ICD-10-CM | POA: Diagnosis not present

## 2022-06-27 DIAGNOSIS — M25511 Pain in right shoulder: Secondary | ICD-10-CM | POA: Diagnosis not present

## 2022-06-27 DIAGNOSIS — M7918 Myalgia, other site: Secondary | ICD-10-CM | POA: Diagnosis not present

## 2022-06-27 DIAGNOSIS — M5137 Other intervertebral disc degeneration, lumbosacral region: Secondary | ICD-10-CM | POA: Diagnosis not present

## 2022-06-27 DIAGNOSIS — M5136 Other intervertebral disc degeneration, lumbar region: Secondary | ICD-10-CM | POA: Diagnosis not present

## 2022-06-27 MED ORDER — TESTOSTERONE 10 MG/ACT (2%) TD GEL
TRANSDERMAL | 0 refills | Status: DC
  Filled 2022-06-27 – 2022-11-06 (×3): qty 60, 30d supply, fill #0

## 2022-06-29 ENCOUNTER — Other Ambulatory Visit: Payer: Self-pay

## 2022-06-30 DIAGNOSIS — M19011 Primary osteoarthritis, right shoulder: Secondary | ICD-10-CM | POA: Diagnosis not present

## 2022-06-30 DIAGNOSIS — M5451 Vertebrogenic low back pain: Secondary | ICD-10-CM | POA: Diagnosis not present

## 2022-06-30 DIAGNOSIS — M7918 Myalgia, other site: Secondary | ICD-10-CM | POA: Diagnosis not present

## 2022-06-30 DIAGNOSIS — M9904 Segmental and somatic dysfunction of sacral region: Secondary | ICD-10-CM | POA: Diagnosis not present

## 2022-06-30 DIAGNOSIS — M9903 Segmental and somatic dysfunction of lumbar region: Secondary | ICD-10-CM | POA: Diagnosis not present

## 2022-06-30 DIAGNOSIS — M5137 Other intervertebral disc degeneration, lumbosacral region: Secondary | ICD-10-CM | POA: Diagnosis not present

## 2022-06-30 DIAGNOSIS — M25511 Pain in right shoulder: Secondary | ICD-10-CM | POA: Diagnosis not present

## 2022-06-30 DIAGNOSIS — M5136 Other intervertebral disc degeneration, lumbar region: Secondary | ICD-10-CM | POA: Diagnosis not present

## 2022-07-01 ENCOUNTER — Other Ambulatory Visit: Payer: Self-pay

## 2022-07-01 NOTE — Telephone Encounter (Signed)
Can you try calling [facility] one more time? If we can't get an answer, we'll call patient and let him know we can do blood work and plan itraconazole. Thank you!

## 2022-07-04 ENCOUNTER — Other Ambulatory Visit: Payer: Self-pay

## 2022-07-07 ENCOUNTER — Other Ambulatory Visit: Payer: Self-pay

## 2022-07-07 DIAGNOSIS — M9904 Segmental and somatic dysfunction of sacral region: Secondary | ICD-10-CM | POA: Diagnosis not present

## 2022-07-07 DIAGNOSIS — M5136 Other intervertebral disc degeneration, lumbar region: Secondary | ICD-10-CM | POA: Diagnosis not present

## 2022-07-07 DIAGNOSIS — M5451 Vertebrogenic low back pain: Secondary | ICD-10-CM | POA: Diagnosis not present

## 2022-07-07 DIAGNOSIS — M19011 Primary osteoarthritis, right shoulder: Secondary | ICD-10-CM | POA: Diagnosis not present

## 2022-07-07 DIAGNOSIS — M5137 Other intervertebral disc degeneration, lumbosacral region: Secondary | ICD-10-CM | POA: Diagnosis not present

## 2022-07-07 DIAGNOSIS — M7918 Myalgia, other site: Secondary | ICD-10-CM | POA: Diagnosis not present

## 2022-07-07 DIAGNOSIS — M9903 Segmental and somatic dysfunction of lumbar region: Secondary | ICD-10-CM | POA: Diagnosis not present

## 2022-07-07 DIAGNOSIS — M25511 Pain in right shoulder: Secondary | ICD-10-CM | POA: Diagnosis not present

## 2022-07-09 DIAGNOSIS — M5136 Other intervertebral disc degeneration, lumbar region: Secondary | ICD-10-CM | POA: Diagnosis not present

## 2022-07-09 DIAGNOSIS — M7918 Myalgia, other site: Secondary | ICD-10-CM | POA: Diagnosis not present

## 2022-07-09 DIAGNOSIS — M19011 Primary osteoarthritis, right shoulder: Secondary | ICD-10-CM | POA: Diagnosis not present

## 2022-07-09 DIAGNOSIS — M9903 Segmental and somatic dysfunction of lumbar region: Secondary | ICD-10-CM | POA: Diagnosis not present

## 2022-07-09 DIAGNOSIS — M9904 Segmental and somatic dysfunction of sacral region: Secondary | ICD-10-CM | POA: Diagnosis not present

## 2022-07-09 DIAGNOSIS — M5137 Other intervertebral disc degeneration, lumbosacral region: Secondary | ICD-10-CM | POA: Diagnosis not present

## 2022-07-09 DIAGNOSIS — M25511 Pain in right shoulder: Secondary | ICD-10-CM | POA: Diagnosis not present

## 2022-07-09 DIAGNOSIS — M5451 Vertebrogenic low back pain: Secondary | ICD-10-CM | POA: Diagnosis not present

## 2022-07-09 NOTE — Telephone Encounter (Signed)
Please order CMP for now and 6 weeks from now and leave lab slip for patient at the front desk (please advise patient). Pending results, will plan itraconazole 200 mg daily for 12 weeks. Would send in 1 months supply and 1 refill. Will send additional refill pending second CMP in 6 weeks. Thank you!

## 2022-07-09 NOTE — Telephone Encounter (Signed)
Called and spoke with Edward Frederick at [facility], she said they do no offer susceptibility testing for antifungals. States she could reach out to [facility] to see if they offer that test and would call use back.

## 2022-07-10 ENCOUNTER — Telehealth: Payer: Self-pay

## 2022-07-10 DIAGNOSIS — B351 Tinea unguium: Secondary | ICD-10-CM

## 2022-07-10 NOTE — Telephone Encounter (Signed)
Called patient to advise: Will order CMP for now and 6 weeks from now and lab slip left for patient at the front desk. Pending results, will plan itraconazole 200 mg daily for 12 weeks. Would send in 1 months supply and 1 refill. Will send additional refill pending second CMP in 6 weeks.  N/A. LMOVM for him to call our office.

## 2022-07-10 NOTE — Telephone Encounter (Signed)
Called patient. N/A. LMOVM to return my call. 10::30. 07/10/2022. JP

## 2022-07-22 ENCOUNTER — Telehealth: Payer: Self-pay

## 2022-07-22 ENCOUNTER — Encounter: Payer: Self-pay | Admitting: Dermatology

## 2022-07-22 NOTE — Telephone Encounter (Signed)
Spoke with patient regarding [facility] EOB. At first the EOB stated lab was out of network and patient would be responsible for $176. Spoke with patient today and he states EOB was reversed and shows no responsibility on his end. Patient will come by with any issues or bills (if received) from [facility].  Per our rep, patient can call the lab and state he can not afford bill and company will help. aw

## 2022-07-28 ENCOUNTER — Other Ambulatory Visit: Payer: Self-pay

## 2022-09-01 ENCOUNTER — Other Ambulatory Visit: Payer: Self-pay

## 2022-09-09 ENCOUNTER — Other Ambulatory Visit: Payer: Self-pay

## 2022-09-09 ENCOUNTER — Ambulatory Visit
Admission: EM | Admit: 2022-09-09 | Discharge: 2022-09-09 | Disposition: A | Discharge status: Home / Self Care | Payer: 59 | Attending: Emergency Medicine | Admitting: Emergency Medicine

## 2022-09-09 DIAGNOSIS — J0100 Acute maxillary sinusitis, unspecified: Secondary | ICD-10-CM | POA: Diagnosis not present

## 2022-09-09 MED ORDER — AMOXICILLIN-POT CLAVULANATE 875-125 MG PO TABS
1.0000 | ORAL_TABLET | Freq: Two times a day (BID) | ORAL | 0 refills | Status: AC
  Filled 2022-09-09: qty 20, 10d supply, fill #0

## 2022-09-09 MED ORDER — IPRATROPIUM BROMIDE 0.03 % NA SOLN
2.0000 | Freq: Two times a day (BID) | NASAL | 12 refills | Status: DC
  Filled 2022-09-09: qty 30, 30d supply, fill #0

## 2022-09-09 NOTE — Discharge Instructions (Addendum)
Today you are being treated for sinus infection and as your symptoms have been present for the past month you will be provided an antibiotic  On exam both of your ears were clogged with wax, they have been cleaned through water irrigation by nursing staff, moving forward you may use over-the-counter Debrox drops to liquefy your wax making it easier to remove, may follow-up with urgent care as needed  Take Augmentin every morning and every evening for 10 days, daily seeing improvement in 48 hours and steady progression from there  You may use ipratropium nasal spray every morning and every evening to further help with the sinuses  You may continue use of Coricidin as needed, may also attempt any of the following below    You can take Tylenol and/or Ibuprofen as needed for fever reduction and pain relief.   For cough: honey 1/2 to 1 teaspoon (you can dilute the honey in water or another fluid).  You can also use guaifenesin and dextromethorphan for cough. You can use a humidifier for chest congestion and cough.  If you don't have a humidifier, you can sit in the bathroom with the hot shower running.      For sore throat: try warm salt water gargles, cepacol lozenges, throat spray, warm tea or water with lemon/honey, popsicles or ice, or OTC cold relief medicine for throat discomfort.   For congestion: take a daily anti-histamine like Zyrtec, Claritin, and a oral decongestant, such as pseudoephedrine.  You can also use Flonase 1-2 sprays in each nostril daily.   It is important to stay hydrated: drink plenty of fluids (water, gatorade/powerade/pedialyte, juices, or teas) to keep your throat moisturized and help further relieve irritation/discomfort.

## 2022-09-09 NOTE — ED Provider Notes (Signed)
[facility]    CSN: [id number] Arrival date & time: 09/09/22  0801      History   Chief Complaint No chief complaint on file.   HPI Edward Frederick is a 62 y.o. male.   Presents for evaluation of nasal congestion, rhinorrhea, sinus pain and pressure and a nonproductive cough present for at least 30 days.  Intermittently experiencing left-sided ear pain.  Overnight pain worsened and was affecting his teeth causing them to ache.  Symptoms initially began as a viral infection that affected his entire family but his symptoms have continued to persist.  Tolerating food and liquids.  Has attempted use of Coricidin and Mucinex with minimal relief.  For 30 days, sinus pressure effcting   Coricadan, mucinex  Intermittent left aside ear [pain, started after entire family had viral illness,    Past Medical History:  Diagnosis Date   Basal cell carcinoma 07/01/2018   L post auricular sulcus   BCC (basal cell carcinoma of skin) 06/04/2022   bcc nodular patterned right postauricular treated with [facility] 06/17/2022   Squamous cell carcinoma of skin 06/04/2017   right temple SCCis    There are no problems to display for this patient.   Past Surgical History:  Procedure Laterality Date   CARPAL TUNNEL RELEASE         Home Medications    Prior to Admission medications   Medication Sig Start Date End Date Taking? Authorizing Provider  albuterol (VENTOLIN HFA) 108 (90 Base) MCG/ACT inhaler Inhale 2 puffs into the lungs every 4 (four) hours as needed for wheezing or shortness of breath (cough, shortness of breath or wheezing.). 08/27/20   McVey, Gelene Mink, PA-C  aspirin EC 81 MG tablet Take 81 mg by mouth daily.    [provider]  azelastine (ASTELIN) 0.1 % nasal spray Place 1 spray into both nostrils 2 (two) times daily. Use in each nostril as directed 08/27/20   McVey, Gelene Mink, PA-C  benzonatate (TESSALON) 100 MG capsule Take 1-2 capsules (100-200 mg  total) by mouth 3 (three) times daily as needed for cough. 08/27/20   McVey, Gelene Mink, PA-C  chlorpheniramine-HYDROcodone (TUSSIONEX PENNKINETIC ER) 10-8 MG/5ML SUER Take 5 mLs by mouth every 12 (twelve) hours as needed for cough. Patient not taking: Reported on 08/24/2017 09/23/16   Versie Starks, PA-C  ciclopirox (LOPROX) 0.77 % cream Apply  a small amount   as directed QD/BID to feet, in between toes 09/22/19   [provider]  influenza vac split quadrivalent PF (FLUARIX QUADRIVALENT) 0.5 ML injection Inject into the muscle. 06/04/22   Carlyle Basques, MD  ketoconazole (NIZORAL) 2 % cream Apply     as directed QD/BID to AA's PRN rash face 09/22/19   [provider]  losartan (COZAAR) 25 MG tablet Take by mouth. 10/09/17 10/09/18  [provider]  losartan (COZAAR) 50 MG tablet Take by mouth. 06/29/19   [provider]  losartan (COZAAR) 50 MG tablet TAKE 1 TABLET (50 MG TOTAL) BY MOUTH ONCE DAILY 02/14/22     methylPREDNISolone (MEDROL DOSEPAK) 4 MG TBPK tablet Follow package directions. 08/30/21     naproxen (NAPROSYN) 500 MG tablet Take 1 tablet (500 mg total) by mouth 2 (two) times daily with a meal. 08/27/20   McVey, Gelene Mink, PA-C  ondansetron (ZOFRAN) 4 MG tablet Take 1 tablet (4 mg total) by mouth every 8 (eight) hours as needed for nausea or vomiting. Patient not taking: Reported on 05/30/2021 08/27/20  McVey, Gelene Mink, PA-C  terbinafine (LAMISIL) 250 MG tablet Take 1 by mouth for 7 days in [facility] June. Repeat at mid-August, mid October, and mid-December Patient not taking: Reported on 05/30/2021 12/25/20   Laurence Ferrari, [place], MD  testosterone (ANDROGEL) 50 MG/5GM (1%) GEL Place 5 g onto the skin daily.    [provider]  Testosterone 10 MG/ACT (2%) GEL Apply 2 Pump (20 mg of testosterone total) topically once daily Apply to the front and inner thighs only. 06/27/22     Testosterone 20.25 MG/ACT (1.62%) GEL APPLY ONE PUMP TO SHOULDER  AND/OR UPPER ARMS ONLY ONCE DAILY AS DIRECTED 07/13/20 01/25/21    Testosterone 20.25 MG/ACT (1.62%) GEL APPLY ONE PUMP TO SHOULDER AND/OR UPPER ARMS ONLY ONCE DAILY AS DIRECTED 04/09/22     triamcinolone cream (KENALOG) 0.1 % Apply  a small amount to affected area  as directed QD/BID to AA's ears, bug bites PRN. Avoid F/G/A Patient not taking: Reported on 05/30/2021 09/22/19   [provider]    Family History History reviewed. No pertinent family history.  Social History Social History   Tobacco Use   Smoking status: Never   Smokeless tobacco: Never  Substance Use Topics   Alcohol use: No    Alcohol/week: 0.0 standard drinks of alcohol   Drug use: No     Allergies   Patient has no known allergies.   Review of Systems Review of Systems  Constitutional: Negative.   HENT:  Positive for congestion, ear pain, rhinorrhea, sinus pressure and sinus pain. Negative for dental problem, drooling, ear discharge, facial swelling, hearing loss, mouth sores, nosebleeds, postnasal drip, sneezing, sore throat, tinnitus, trouble swallowing and voice change.   Respiratory:  Positive for cough. Negative for apnea, choking, chest tightness, shortness of breath, wheezing and stridor.   Skin: Negative.   Neurological: Negative.      Physical Exam Triage Vital Signs ED Triage Vitals  Enc Vitals Group     BP 09/09/22 0809 (!) 157/99     Pulse Rate 09/09/22 0809 81     Resp 09/09/22 0809 20     Temp 09/09/22 0809 97.9 F (36.6 C)     Temp Source 09/09/22 0809 Oral     SpO2 09/09/22 0809 98 %     Weight --      Height --      Head Circumference --      Peak Flow --      Pain Score 09/09/22 0812 0     Pain Loc --      Pain Edu? --      Excl. in [place]? --    No data found.  Updated Vital Signs BP (!) 157/99 (BP Location: Left Arm)   Pulse 81   Temp 97.9 F (36.6 C) (Oral)   Resp 20   SpO2 98%   Visual Acuity Right Eye Distance:   Left Eye Distance:   Bilateral Distance:     Right Eye Near:   Left Eye Near:    Bilateral Near:     Physical Exam Constitutional:      Appearance: Normal appearance.  HENT:     Head: Normocephalic.     Right Ear: There is impacted cerumen.     Left Ear: There is impacted cerumen.     Nose: Congestion and rhinorrhea present.     Right Sinus: Maxillary sinus tenderness present. No frontal sinus tenderness.     Left Sinus: Maxillary sinus tenderness present. No frontal sinus  tenderness.     Mouth/Throat:     Mouth: Mucous membranes are moist.     Pharynx: Oropharynx is clear. No posterior oropharyngeal erythema.     Tonsils: No tonsillar exudate. 0 on the right. 0 on the left.  Eyes:     Extraocular Movements: Extraocular movements intact.  Cardiovascular:     Rate and Rhythm: Normal rate and regular rhythm.     Pulses: Normal pulses.     Heart sounds: Normal heart sounds.  Pulmonary:     Effort: Pulmonary effort is normal.     Breath sounds: Normal breath sounds.  Skin:    General: Skin is warm and dry.  Neurological:     Mental Status: He is alert and oriented to person, place, and time. Mental status is at baseline.      UC Treatments / Results  Labs (all labs ordered are listed, but only abnormal results are displayed) Labs Reviewed - No data to display  EKG   Radiology No results found.  Procedures Procedures (including critical care time)  Medications Ordered in UC Medications - No data to display  Initial Impression / Assessment and Plan / UC Course  I have reviewed the triage vital signs and the nursing notes.  Pertinent labs & imaging results that were available during my care of the patient were reviewed by me and considered in my medical decision making (see chart for details).  Acute nonrecurrent maxillary sinusitis, impacted cerumen bilateral  Vitals on General patient is in no signs of distress or toxic appearing, tenderness and congestion is noted within the sinuses and the nasal  turbinates, discussed findings, symptoms have been present for 30 days without resolution we will provide bacterial coverage, Augmentin prescribed as well as ipratropium for additional support, recommended continued use of over-the-counter medications, bilateral ear irrigation completed by nursing staff, somewhat successful, able to visually tympanic membrane on the left but not right, recommended Debrox drops for home use, patient endorses after cleaning that he is able to hear better, may follow-up with this urgent care as needed Final Clinical Impressions(s) / UC Diagnoses   Final diagnoses:  None   Discharge Instructions   None    ED Prescriptions   None    PDMP not reviewed this encounter.   Hans Eden, [place] 09/09/22 [phone]

## 2022-09-09 NOTE — ED Triage Notes (Signed)
Pt reports sinus drainage and burning , cough, head pressure, teeth and gum pain x 2 days.

## 2022-09-29 ENCOUNTER — Other Ambulatory Visit: Payer: Self-pay

## 2022-09-29 MED ORDER — LOSARTAN POTASSIUM 50 MG PO TABS
50.0000 mg | ORAL_TABLET | Freq: Every day | ORAL | 1 refills | Status: DC
  Filled 2022-09-29: qty 30, 30d supply, fill #0
  Filled 2022-11-06: qty 30, 30d supply, fill #1
  Filled 2022-12-01: qty 30, 30d supply, fill #2
  Filled 2023-01-01: qty 30, 30d supply, fill #3
  Filled 2023-01-30: qty 30, 30d supply, fill #4
  Filled 2023-03-03: qty 30, 30d supply, fill #5

## 2022-09-30 ENCOUNTER — Other Ambulatory Visit: Payer: Self-pay

## 2022-10-01 ENCOUNTER — Other Ambulatory Visit: Payer: Self-pay

## 2022-10-06 ENCOUNTER — Other Ambulatory Visit: Payer: Self-pay

## 2022-10-06 MED ORDER — SILDENAFIL CITRATE 50 MG PO TABS
25.0000 mg | ORAL_TABLET | Freq: Every day | ORAL | 2 refills | Status: DC | PRN
  Filled 2022-10-06: qty 10, 5d supply, fill #0
  Filled 2023-02-26: qty 10, 5d supply, fill #1

## 2022-11-06 ENCOUNTER — Other Ambulatory Visit: Payer: Self-pay

## 2022-11-13 ENCOUNTER — Other Ambulatory Visit: Payer: Self-pay

## 2022-11-13 MED ORDER — TESTOSTERONE 10 MG/ACT (2%) TD GEL
20.0000 mg | Freq: Every day | TRANSDERMAL | 0 refills | Status: DC
  Filled 2022-11-13 – 2022-11-21 (×4): qty 60, 30d supply, fill #0

## 2022-11-19 ENCOUNTER — Other Ambulatory Visit: Payer: Self-pay

## 2022-11-19 ENCOUNTER — Encounter: Payer: Self-pay | Admitting: Dermatology

## 2022-11-19 ENCOUNTER — Ambulatory Visit: Payer: 59 | Admitting: Dermatology

## 2022-11-19 DIAGNOSIS — L570 Actinic keratosis: Secondary | ICD-10-CM

## 2022-11-19 DIAGNOSIS — Z1283 Encounter for screening for malignant neoplasm of skin: Secondary | ICD-10-CM

## 2022-11-19 DIAGNOSIS — S0081XA Abrasion of other part of head, initial encounter: Secondary | ICD-10-CM

## 2022-11-19 DIAGNOSIS — L814 Other melanin hyperpigmentation: Secondary | ICD-10-CM

## 2022-11-19 DIAGNOSIS — T148XXA Other injury of unspecified body region, initial encounter: Secondary | ICD-10-CM

## 2022-11-19 DIAGNOSIS — Z86007 Personal history of in-situ neoplasm of skin: Secondary | ICD-10-CM

## 2022-11-19 DIAGNOSIS — L821 Other seborrheic keratosis: Secondary | ICD-10-CM

## 2022-11-19 DIAGNOSIS — D229 Melanocytic nevi, unspecified: Secondary | ICD-10-CM

## 2022-11-19 DIAGNOSIS — L578 Other skin changes due to chronic exposure to nonionizing radiation: Secondary | ICD-10-CM

## 2022-11-19 DIAGNOSIS — L820 Inflamed seborrheic keratosis: Secondary | ICD-10-CM | POA: Diagnosis not present

## 2022-11-19 DIAGNOSIS — Z85828 Personal history of other malignant neoplasm of skin: Secondary | ICD-10-CM

## 2022-11-19 NOTE — Patient Instructions (Addendum)
Cryotherapy Aftercare  Wash gently with soap and water everyday.   Apply Vaseline and Band-Aid daily until healed.   Recommend taking Heliocare sun protection supplement daily in sunny weather for additional sun protection. For maximum protection on the sunniest days, you can take up to 2 capsules of regular Heliocare OR take 1 capsule of Heliocare Ultra. For prolonged exposure (such as a full day in the sun), you can repeat your dose of the supplement 4 hours after your first dose. Heliocare can be purchased at [facility], at some [facility] or at www.heliocare.com.    Recommend Niacinamide or Nicotinamide 500mg twice per day to lower risk of non-melanoma skin cancer by approximately 25%. This is usually available at [employer].   Melanoma ABCDEs  Melanoma is the most dangerous type of skin cancer, and is the leading cause of death from skin disease.  You are more likely to develop melanoma if you: Have light-colored skin, light-colored eyes, or red or blond hair Spend a lot of time in the sun Tan regularly, either outdoors or in a tanning bed Have had blistering sunburns, especially during childhood Have a close family member who has had a melanoma Have atypical moles or large birthmarks  Early detection of melanoma is key since treatment is typically straightforward and cure rates are extremely high if we catch it early.   The first sign of melanoma is often a change in a mole or a new dark spot.  The ABCDE system is a way of remembering the signs of melanoma.  A for asymmetry:  The two halves do not match. B for border:  The edges of the growth are irregular. C for color:  A mixture of colors are present instead of an even brown color. D for diameter:  Melanomas are usually (but not always) greater than 6mm - the size of a pencil eraser. E for evolution:  The spot keeps changing in size, shape, and color.  Please check your skin once per month between visits. You can use  a small mirror in front and a large mirror behind you to keep an eye on the back side or your body.   If you see any new or changing lesions before your next follow-up, please call to schedule a visit.  Please continue daily skin protection including broad spectrum sunscreen SPF 30+ to sun-exposed areas, reapplying every 2 hours as needed when you're outdoors.    Due to recent changes in healthcare laws, you may see results of your pathology and/or laboratory studies on MyChart before the doctors have had a chance to review them. We understand that in some cases there may be results that are confusing or concerning to you. Please understand that not all results are received at the same time and often the doctors may need to interpret multiple results in order to provide you with the best plan of care or course of treatment. Therefore, we ask that you please give us 2 business days to thoroughly review all your results before contacting the office for clarification. Should we see a critical lab result, you will be contacted sooner.   If You Need Anything After Your Visit  If you have any questions or concerns for your doctor, please call our main line at [phone] and press option 4 to reach your doctor's medical assistant. If no one answers, please leave a voicemail as directed and we will return your call as soon as possible. Messages left after 4 pm will   be answered the following business day.   You may also send us a message via MyChart. We typically respond to MyChart messages within 1-2 business days.  For prescription refills, please ask your pharmacy to contact our office. Our fax number is [phone].  If you have an urgent issue when the clinic is closed that cannot wait until the next business day, you can page your doctor at the number below.    Please note that while we do our best to be available for urgent issues outside of office hours, we are not available 24/7.   If you  have an urgent issue and are unable to reach us, you may choose to seek medical care at your doctor's office, retail clinic, urgent care center, or emergency room.  If you have a medical emergency, please immediately call 911 or go to the emergency department.  Pager Numbers  - Dr. Kowalski: [phone]  - Dr. Moye: [phone]  - Dr. Stewart: [phone]  In the event of inclement weather, please call our main line at [phone] for an update on the status of any delays or closures.  Dermatology Medication Tips: Please keep the boxes that topical medications come in in order to help keep track of the instructions about where and how to use these. Pharmacies typically print the medication instructions only on the boxes and not directly on the medication tubes.   If your medication is too expensive, please contact our office at [phone] option 4 or send us a message through MyChart.   We are unable to tell what your co-pay for medications will be in advance as this is different depending on your insurance coverage. However, we may be able to find a substitute medication at lower cost or fill out paperwork to get insurance to cover a needed medication.   If a prior authorization is required to get your medication covered by your insurance company, please allow us 1-2 business days to complete this process.  Drug prices often vary depending on where the prescription is filled and some pharmacies may offer cheaper prices.  The website www.goodrx.com contains coupons for medications through different pharmacies. The prices here do not account for what the cost may be with help from insurance (it may be cheaper with your insurance), but the website can give you the price if you did not use any insurance.  - You can print the associated coupon and take it with your prescription to the pharmacy.  - You may also stop by our office during regular business hours and pick up a GoodRx coupon  card.  - If you need your prescription sent electronically to a different pharmacy, notify our office through [facility] MyChart or by phone at [phone] option 4.     Si Usted Necesita Algo Despus de Su Visita  Tambin puede enviarnos un mensaje a travs de MyChart. Por lo general respondemos a los mensajes de MyChart en el transcurso de 1 a 2 das hbiles.  Para renovar recetas, por favor pida a su farmacia que se ponga en contacto con nuestra oficina. Nuestro nmero de fax es el [phone].  Si tiene un asunto urgente cuando la clnica est cerrada y que no puede esperar hasta el siguiente da hbil, puede llamar/localizar a su doctor(a) al nmero que aparece a continuacin.   Por favor, tenga en cuenta que aunque hacemos todo lo posible para estar disponibles para asuntos urgentes fuera del horario de oficina, no estamos disponibles las 24 horas del   da, los 7 das de la semana.   Si tiene un problema urgente y no puede comunicarse con nosotros, puede optar por buscar atencin mdica  en el consultorio de su doctor(a), en una clnica privada, en un centro de atencin urgente o en una sala de emergencias.  Si tiene una emergencia mdica, por favor llame inmediatamente al 911 o vaya a la sala de emergencias.  Nmeros de bper  - Dr. Kowalski: [phone]  - Dra. Moye: [phone]  - Dra. Stewart: [phone]  En caso de inclemencias del tiempo, por favor llame a nuestra lnea principal al [phone] para una actualizacin sobre el estado de cualquier retraso o cierre.  Consejos para la medicacin en dermatologa: Por favor, guarde las cajas en las que vienen los medicamentos de uso tpico para ayudarle a seguir las instrucciones sobre dnde y cmo usarlos. Las farmacias generalmente imprimen las instrucciones del medicamento slo en las cajas y no directamente en los tubos del medicamento.   Si su medicamento es muy caro, por favor, pngase en contacto con nuestra  oficina llamando al [phone] y presione la opcin 4 o envenos un mensaje a travs de MyChart.   No podemos decirle cul ser su copago por los medicamentos por adelantado ya que esto es diferente dependiendo de la cobertura de su seguro. Sin embargo, es posible que podamos encontrar un medicamento sustituto a menor costo o llenar un formulario para que el seguro cubra el medicamento que se considera necesario.   Si se requiere una autorizacin previa para que su compaa de seguros cubra su medicamento, por favor permtanos de 1 a 2 das hbiles para completar este proceso.  Los precios de los medicamentos varan con frecuencia dependiendo del lugar de dnde se surte la receta y alguna farmacias pueden ofrecer precios ms baratos.  El sitio web www.goodrx.com tiene cupones para medicamentos de diferentes farmacias. Los precios aqu no tienen en cuenta lo que podra costar con la ayuda del seguro (puede ser ms barato con su seguro), pero el sitio web puede darle el precio si no utiliz ningn seguro.  - Puede imprimir el cupn correspondiente y llevarlo con su receta a la farmacia.  - Tambin puede pasar por nuestra oficina durante el horario de atencin regular y recoger una tarjeta de cupones de GoodRx.  - Si necesita que su receta se enve electrnicamente a una farmacia diferente, informe a nuestra oficina a travs de MyChart de [place] o por telfono llamando al [phone] y presione la opcin 4.  

## 2022-11-19 NOTE — Progress Notes (Signed)
Follow-Up Visit   Subjective  Edward Frederick is a 62 y.o. male who presents for the following: Skin Cancer Screening and Full Body Skin Exam  The patient presents for Total-Body Skin Exam (TBSE) for skin cancer screening and mole check. The patient has spots, moles and lesions to be evaluated, some may be new or changing and the patient has concerns that these could be cancer.  Hx of BCC, SCC. Patient with a spot at right neck and one at each side of face that get irritated.  The following portions of the chart were reviewed this encounter and updated as appropriate: medications, allergies, medical history  Review of Systems:  No other skin or systemic complaints except as noted in HPI or Assessment and Plan.  Objective  Well appearing patient in no apparent distress; mood and affect are within normal limits.  A full examination was performed including scalp, head, eyes, ears, nose, lips, neck, chest, axillae, abdomen, back, buttocks, bilateral upper extremities, bilateral lower extremities, hands, feet, fingers, toes, fingernails, and toenails. All findings within normal limits unless otherwise noted below.   Relevant physical exam findings are noted in the Assessment and Plan.  right nasofacial angle Excoriation without features suspicious for malignancy on dermoscopy     Assessment & Plan   LENTIGINES, SEBORRHEIC KERATOSES, HEMANGIOMAS - Benign normal skin lesions - Benign-appearing - Call for any changes  MELANOCYTIC NEVI - Tan-brown and/or pink-flesh-colored symmetric macules and papules - Benign appearing on exam today - Observation - Call clinic for new or changing moles - Recommend daily use of broad spectrum spf 30+ sunscreen to sun-exposed areas.   ACTINIC DAMAGE - Chronic condition, secondary to cumulative UV/sun exposure - diffuse scaly erythematous macules with underlying dyspigmentation - Recommend daily broad spectrum sunscreen SPF 30+ to sun-exposed areas,  reapply every 2 hours as needed.  - Staying in the shade or wearing long sleeves, sun glasses (UVA+UVB protection) and wide brim hats (4-inch brim around the entire circumference of the hat) are also recommended for sun protection.  - Call for new or changing lesions.  SKIN CANCER SCREENING PERFORMED TODAY.  HISTORY OF SQUAMOUS CELL CARCINOMA IN SITU OF THE SKIN - No evidence of recurrence today - Recommend regular full body skin exams - Recommend daily broad spectrum sunscreen SPF 30+ to sun-exposed areas, reapply every 2 hours as needed.  - Call if any new or changing lesions are noted between office visits  HISTORY OF BASAL CELL CARCINOMA OF THE SKIN - No evidence of recurrence today - Recommend regular full body skin exams - Recommend daily broad spectrum sunscreen SPF 30+ to sun-exposed areas, reapply every 2 hours as needed.  - Call if any new or changing lesions are noted between office visits  ACTINIC KERATOSIS Exam: Erythematous thin papules/macules with gritty scale  Actinic keratoses are precancerous spots that appear secondary to cumulative UV radiation exposure/sun exposure over time. They are chronic with expected duration over 1 year. A portion of actinic keratoses will progress to squamous cell carcinoma of the skin. It is not possible to reliably predict which spots will progress to skin cancer and so treatment is recommended to prevent development of skin cancer.  Recommend daily broad spectrum sunscreen SPF 30+ to sun-exposed areas, reapply every 2 hours as needed.  Recommend staying in the shade or wearing long sleeves, sun glasses (UVA+UVB protection) and wide brim hats (4-inch brim around the entire circumference of the hat). Call for new or changing lesions.  Treatment Plan:  Prior to procedure, discussed risks of blister formation, small wound, skin dyspigmentation, or rare scar following cryotherapy. Recommend Vaseline ointment to treated areas while  healing.  Destruction Procedure Note Destruction method: cryotherapy   Informed consent: discussed and consent obtained   Lesion destroyed using liquid nitrogen: Yes   Outcome: patient tolerated procedure well with no complications   Post-procedure details: wound care instructions given   Locations: right postauricular  # of Lesions Treated: 1  Excoriation right nasofacial angle  Excoriation > AK  Benign-appearing.  Recheck at f/u.  Call clinic for new or changing lesions or if this does not resolve.  Recommend daily use of broad spectrum spf 30+ sunscreen to sun-exposed areas.       INFLAMED SEBORRHEIC KERATOSIS Exam: Erythematous keratotic or waxy stuck-on papule or plaque.  Symptomatic, irritating, patient would like treated.  Benign-appearing.  Call clinic for new or changing lesions.   Prior to procedure, discussed risks of blister formation, small wound, skin dyspigmentation, or rare scar following treatment. Recommend Vaseline ointment to treated areas while healing.  Destruction Procedure Note Destruction method: cryotherapy   Informed consent: discussed and consent obtained   Lesion destroyed using liquid nitrogen: Yes   Outcome: patient tolerated procedure well with no complications   Post-procedure details: wound care instructions given   Locations: left cheek, right clavicle, right cheek # of Lesions Treated: 3  Return in about 1 year (around 11/19/2023) for TBSE, Hx SCCis, Hx BCC, Hx AK.  Anise SalvoI,Jackie Stephens, RMA, am acting as scribe for Darden DatesVIRGINA Markis Langland, MD .   Documentation: I have reviewed the above documentation for accuracy and completeness, and I agree with the above.  Darden DatesVIRGINA Shelene Krage, MD

## 2022-11-21 ENCOUNTER — Other Ambulatory Visit: Payer: Self-pay

## 2022-11-24 ENCOUNTER — Other Ambulatory Visit: Payer: Self-pay

## 2022-11-25 ENCOUNTER — Other Ambulatory Visit: Payer: Self-pay

## 2023-01-01 ENCOUNTER — Other Ambulatory Visit: Payer: Self-pay

## 2023-01-01 MED ORDER — TESTOSTERONE 10 MG/ACT (2%) TD GEL
2.0000 | Freq: Every day | TRANSDERMAL | 0 refills | Status: DC
  Filled 2023-01-01: qty 60, 30d supply, fill #0

## 2023-01-02 ENCOUNTER — Other Ambulatory Visit: Payer: Self-pay

## 2023-01-09 ENCOUNTER — Other Ambulatory Visit: Payer: Self-pay

## 2023-01-09 MED ORDER — NA SULFATE-K SULFATE-MG SULF 17.5-3.13-1.6 GM/177ML PO SOLN
ORAL | 0 refills | Status: DC
  Filled 2023-01-09: qty 354, 1d supply, fill #0

## 2023-01-30 ENCOUNTER — Other Ambulatory Visit: Payer: Self-pay

## 2023-02-01 ENCOUNTER — Other Ambulatory Visit: Payer: Self-pay

## 2023-02-01 MED ORDER — TESTOSTERONE 10 MG/ACT (2%) TD GEL
TRANSDERMAL | 0 refills | Status: DC
  Filled 2023-02-01 – 2023-02-05 (×2): qty 60, 30d supply, fill #0

## 2023-02-02 ENCOUNTER — Other Ambulatory Visit: Payer: Self-pay

## 2023-02-03 ENCOUNTER — Other Ambulatory Visit: Payer: Self-pay

## 2023-02-04 ENCOUNTER — Other Ambulatory Visit: Payer: Self-pay

## 2023-02-05 ENCOUNTER — Other Ambulatory Visit: Payer: Self-pay

## 2023-02-06 ENCOUNTER — Other Ambulatory Visit: Payer: Self-pay

## 2023-02-09 ENCOUNTER — Other Ambulatory Visit: Payer: Self-pay

## 2023-02-10 ENCOUNTER — Other Ambulatory Visit: Payer: Self-pay

## 2023-02-16 ENCOUNTER — Other Ambulatory Visit: Payer: Self-pay

## 2023-02-18 ENCOUNTER — Other Ambulatory Visit: Payer: Self-pay

## 2023-02-20 ENCOUNTER — Other Ambulatory Visit: Payer: Self-pay

## 2023-02-25 ENCOUNTER — Other Ambulatory Visit: Payer: Self-pay

## 2023-02-25 MED ORDER — TESTOSTERONE 1.62 % TD GEL
1.0000 | Freq: Every day | TRANSDERMAL | 0 refills | Status: DC
  Filled 2023-02-25 (×2): qty 75, 30d supply, fill #0

## 2023-02-26 ENCOUNTER — Other Ambulatory Visit: Payer: Self-pay

## 2023-03-03 ENCOUNTER — Other Ambulatory Visit: Payer: Self-pay

## 2023-03-04 ENCOUNTER — Other Ambulatory Visit: Payer: Self-pay

## 2023-03-20 ENCOUNTER — Other Ambulatory Visit: Payer: Self-pay

## 2023-04-03 ENCOUNTER — Other Ambulatory Visit: Payer: Self-pay

## 2023-04-06 ENCOUNTER — Other Ambulatory Visit: Payer: Self-pay

## 2023-04-07 ENCOUNTER — Other Ambulatory Visit: Payer: Self-pay

## 2023-04-07 MED ORDER — LOSARTAN POTASSIUM 50 MG PO TABS
50.0000 mg | ORAL_TABLET | Freq: Every day | ORAL | 1 refills | Status: DC
  Filled 2023-04-07: qty 30, 30d supply, fill #0
  Filled 2023-05-01: qty 30, 30d supply, fill #1
  Filled 2023-06-01 (×2): qty 30, 30d supply, fill #2
  Filled 2023-07-03: qty 30, 30d supply, fill #3
  Filled 2023-07-31: qty 30, 30d supply, fill #4
  Filled 2023-08-31: qty 30, 30d supply, fill #5

## 2023-04-07 MED ORDER — TESTOSTERONE 20.25 MG/ACT (1.62%) TD GEL
1.0000 | Freq: Every day | TRANSDERMAL | 0 refills | Status: DC
  Filled 2023-04-07: qty 75, 60d supply, fill #0

## 2023-04-08 ENCOUNTER — Other Ambulatory Visit: Payer: Self-pay

## 2023-04-14 ENCOUNTER — Other Ambulatory Visit: Payer: Self-pay

## 2023-04-16 ENCOUNTER — Other Ambulatory Visit: Payer: Self-pay

## 2023-04-20 ENCOUNTER — Ambulatory Visit: Admit: 2023-04-20 | Payer: 59

## 2023-04-20 SURGERY — COLONOSCOPY WITH PROPOFOL
Anesthesia: General

## 2023-04-23 ENCOUNTER — Other Ambulatory Visit: Payer: Self-pay

## 2023-04-23 MED ORDER — TESTOSTERONE 1.62 % TD GEL
2.0000 | Freq: Every day | TRANSDERMAL | 0 refills | Status: DC
  Filled 2023-06-23 – 2023-07-02 (×2): qty 75, 30d supply, fill #0

## 2023-05-01 ENCOUNTER — Other Ambulatory Visit: Payer: Self-pay

## 2023-06-01 ENCOUNTER — Other Ambulatory Visit: Payer: Self-pay

## 2023-06-10 ENCOUNTER — Encounter: Payer: 59 | Admitting: Dermatology

## 2023-06-23 ENCOUNTER — Other Ambulatory Visit: Payer: Self-pay

## 2023-06-24 ENCOUNTER — Other Ambulatory Visit: Payer: Self-pay

## 2023-06-24 MED ORDER — TESTOSTERONE 1.62 % TD GEL
2.0000 | Freq: Every day | TRANSDERMAL | 0 refills | Status: DC
  Filled 2023-06-24 – 2023-07-31 (×3): qty 75, 30d supply, fill #0

## 2023-06-25 ENCOUNTER — Other Ambulatory Visit: Payer: Self-pay

## 2023-06-30 ENCOUNTER — Other Ambulatory Visit: Payer: Self-pay

## 2023-07-02 ENCOUNTER — Other Ambulatory Visit: Payer: Self-pay

## 2023-07-03 ENCOUNTER — Other Ambulatory Visit: Payer: Self-pay

## 2023-07-31 ENCOUNTER — Other Ambulatory Visit: Payer: Self-pay

## 2023-08-31 ENCOUNTER — Other Ambulatory Visit: Payer: Self-pay

## 2023-09-02 ENCOUNTER — Other Ambulatory Visit: Payer: Self-pay

## 2023-09-04 ENCOUNTER — Other Ambulatory Visit (HOSPITAL_BASED_OUTPATIENT_CLINIC_OR_DEPARTMENT_OTHER): Payer: Self-pay

## 2023-10-01 ENCOUNTER — Other Ambulatory Visit: Payer: Self-pay

## 2023-10-02 ENCOUNTER — Other Ambulatory Visit: Payer: Self-pay

## 2023-10-02 MED ORDER — LOSARTAN POTASSIUM 50 MG PO TABS
50.0000 mg | ORAL_TABLET | Freq: Every day | ORAL | 3 refills | Status: DC
  Filled 2023-10-02: qty 30, 30d supply, fill #0
  Filled 2023-11-03: qty 30, 30d supply, fill #1
  Filled 2023-11-30: qty 30, 30d supply, fill #2
  Filled 2023-12-29: qty 30, 30d supply, fill #3
  Filled 2024-02-01: qty 30, 30d supply, fill #4
  Filled 2024-03-03: qty 30, 30d supply, fill #5
  Filled 2024-03-30: qty 30, 30d supply, fill #6
  Filled 2024-05-06: qty 30, 30d supply, fill #7
  Filled 2024-06-01: qty 30, 30d supply, fill #8
  Filled 2024-07-01: qty 30, 30d supply, fill #9
  Filled 2024-08-01: qty 30, 30d supply, fill #10

## 2023-10-09 ENCOUNTER — Ambulatory Visit
Admission: EM | Admit: 2023-10-09 | Discharge: 2023-10-09 | Disposition: A | Discharge status: Home / Self Care | Payer: 59 | Attending: Physician Assistant | Admitting: Physician Assistant

## 2023-10-09 ENCOUNTER — Encounter: Payer: Self-pay | Admitting: Emergency Medicine

## 2023-10-09 DIAGNOSIS — R0981 Nasal congestion: Secondary | ICD-10-CM

## 2023-10-09 DIAGNOSIS — J019 Acute sinusitis, unspecified: Secondary | ICD-10-CM

## 2023-10-09 MED ORDER — PREDNISONE 20 MG PO TABS: 40.0000 mg | ORAL_TABLET | Freq: Every day | ORAL | 0 refills | Status: AC

## 2023-10-09 MED ORDER — IPRATROPIUM BROMIDE 0.06 % NA SOLN: 2.0000 | Freq: Four times a day (QID) | NASAL | 0 refills | Status: DC

## 2023-10-09 MED ORDER — AMOXICILLIN-POT CLAVULANATE 875-125 MG PO TABS: 1.0000 | ORAL_TABLET | Freq: Two times a day (BID) | ORAL | 0 refills | Status: AC

## 2023-10-09 NOTE — ED Provider Notes (Signed)
 [facility]    CSN: [id number] Arrival date & time: 10/09/23  1418      History   Chief Complaint Chief Complaint  Patient presents with   Nasal Congestion   Sinus Problem    HPI Edward Frederick is a 63 y.o. male presenting for 3 week history of nasal congestion, sinus pressure and post nasal drainage. Denies fever, fatigue, headaches, body aches, cough, chest pain, shortness of breath, nausea or vomiting. Has been taking OTC meds (claritin, sudafed, mucinex D and flonase) without relief.  HPI  Past Medical History:  Diagnosis Date   Basal cell carcinoma 07/01/2018   L post auricular sulcus   BCC (basal cell carcinoma of skin) 06/04/2022   bcc nodular patterned right postauricular treated with [facility] 06/17/2022   Squamous cell carcinoma of skin 06/04/2017   right temple SCCis    There are no active problems to display for this patient.   Past Surgical History:  Procedure Laterality Date   CARPAL TUNNEL RELEASE         Home Medications    Prior to Admission medications   Medication Sig Start Date End Date Taking? Authorizing Provider  amoxicillin-clavulanate (AUGMENTIN) 875-125 MG tablet Take 1 tablet by mouth every 12 (twelve) hours for 7 days. 10/09/23 10/16/23 Yes Eusebio Friendly B, PA-C  ipratropium (ATROVENT) 0.06 % nasal spray Place 2 sprays into both nostrils 4 (four) times daily. 10/09/23  Yes Eusebio Friendly B, PA-C  losartan (COZAAR) 50 MG tablet Take 1 tablet (50 mg total) by mouth daily. 10/02/23  Yes   predniSONE (DELTASONE) 20 MG tablet Take 2 tablets (40 mg total) by mouth daily for 5 days. 10/09/23 10/14/23 Yes Shirlee Latch, PA-C  Testosterone 20.25 MG/ACT (1.62%) GEL Place 1 Pump onto the skin daily. Apply to shoulders and/or upper arms only. 04/06/23  Yes   albuterol (VENTOLIN HFA) 108 (90 Base) MCG/ACT inhaler Inhale 2 puffs into the lungs every 4 (four) hours as needed for wheezing or shortness of breath (cough, shortness of breath or  wheezing.). Patient not taking: Reported on 11/19/2022 08/27/20   McVey, Madelaine Bhat, PA-C  aspirin EC 81 MG tablet Take 81 mg by mouth daily. Patient not taking: Reported on 11/19/2022    [provider]  azelastine (ASTELIN) 0.1 % nasal spray Place 1 spray into both nostrils 2 (two) times daily. Use in each nostril as directed Patient not taking: Reported on 11/19/2022 08/27/20   McVey, Madelaine Bhat, PA-C  benzonatate (TESSALON) 100 MG capsule Take 1-2 capsules (100-200 mg total) by mouth 3 (three) times daily as needed for cough. Patient not taking: Reported on 11/19/2022 08/27/20   McVey, Madelaine Bhat, PA-C  chlorpheniramine-HYDROcodone [facility] ER) 10-8 MG/5ML SUER Take 5 mLs by mouth every 12 (twelve) hours as needed for cough. Patient not taking: Reported on 08/24/2017 09/23/16   Faythe Ghee, PA-C  ciclopirox (LOPROX) 0.77 % cream Apply  a small amount   as directed QD/BID to feet, in between toes Patient not taking: Reported on 11/19/2022 09/22/19   [provider]  influenza vac split quadrivalent PF (FLUARIX QUADRIVALENT) 0.5 ML injection Inject into the muscle. Patient not taking: Reported on 11/19/2022 06/04/22   Judyann Munson, MD  ketoconazole (NIZORAL) 2 % cream Apply     as directed QD/BID to AA's PRN rash face Patient not taking: Reported on 11/19/2022 09/22/19   [provider]  losartan (COZAAR) 25 MG tablet Take by mouth. 10/09/17 10/09/18  [provider]  losartan (COZAAR) 50 MG tablet Take by mouth. Patient not taking: Reported on 11/19/2022 06/29/19   [provider]  methylPREDNISolone (MEDROL DOSEPAK) 4 MG TBPK tablet Follow package directions. Patient not taking: Reported on 11/19/2022 08/30/21     Na Sulfate-K Sulfate-Mg Sulf 17.5-3.13-1.6 GM/177ML SOLN Take 2 Bottles (1 kit total) by mouth as directed One kit contains 2 bottles.  Take both bottles at the times instructed by your provider. 01/09/23      naproxen (NAPROSYN) 500 MG tablet Take 1 tablet (500 mg total) by mouth 2 (two) times daily with a meal. Patient not taking: Reported on 11/19/2022 08/27/20   McVey, Madelaine Bhat, PA-C  ondansetron (ZOFRAN) 4 MG tablet Take 1 tablet (4 mg total) by mouth every 8 (eight) hours as needed for nausea or vomiting. Patient not taking: Reported on 05/30/2021 08/27/20   McVey, Madelaine Bhat, PA-C  sildenafil (VIAGRA) 50 MG tablet Take 0.5-2 tablets (25-100 mg total) by mouth daily as needed for erectile dysfunction Patient not taking: Reported on 11/19/2022 10/06/22     terbinafine (LAMISIL) 250 MG tablet Take 1 by mouth for 7 days in [facility] June. Repeat at mid-August, mid October, and mid-December Patient not taking: Reported on 05/30/2021 12/25/20   Neale Burly, [place], MD  testosterone (ANDROGEL) 50 MG/5GM (1%) GEL Place 5 g onto the skin daily.    [provider]  Testosterone 1.62 % GEL Apply 2 Pump topically daily to the front and inner thighs only. 04/22/23     Testosterone 1.62 % GEL Apply 2 Pump topically daily. Apply to the front and inner thighs only. 06/23/23     Testosterone 10 MG/ACT (2%) GEL Apply 2 Pump (20 mg of testosterone total) topically once daily Apply to the front and inner thighs only. Patient not taking: Reported on 11/19/2022 06/27/22     Testosterone 20.25 MG/ACT (1.62%) GEL APPLY ONE PUMP TO SHOULDER AND/OR UPPER ARMS ONLY ONCE DAILY AS DIRECTED 07/13/20 01/25/21    triamcinolone cream (KENALOG) 0.1 % Apply  a small amount to affected area  as directed QD/BID to AA's ears, bug bites PRN. Avoid F/G/A Patient not taking: Reported on 05/30/2021 09/22/19   [provider]    Family History History reviewed. No pertinent family history.  Social History Social History   Tobacco Use   Smoking status: Never   Smokeless tobacco: Never  Vaping Use   Vaping status: Never Used  Substance Use Topics   Alcohol use: No    Alcohol/week: 0.0 standard drinks of alcohol    Drug use: No     Allergies   Patient has no known allergies.   Review of Systems Review of Systems  Constitutional:  Negative for fatigue and fever.  HENT:  Positive for congestion, rhinorrhea and sinus pressure. Negative for sinus pain and sore throat.   Respiratory:  Negative for cough and shortness of breath.   Cardiovascular:  Negative for chest pain.  Gastrointestinal:  Negative for abdominal pain, diarrhea, nausea and vomiting.  Musculoskeletal:  Negative for myalgias.  Neurological:  Negative for weakness, light-headedness and headaches.  Hematological:  Negative for adenopathy.     Physical Exam Triage Vital Signs ED Triage Vitals  Encounter Vitals Group     BP      Systolic BP Percentile      Diastolic BP Percentile      Pulse      Resp      Temp      Temp src  SpO2      Weight      Height      Head Circumference      Peak Flow      Pain Score      Pain Loc      Pain Education      Exclude from Growth Chart    No data found.  Updated Vital Signs BP (!) 148/91 (BP Location: Left Arm)   Pulse 69   Temp 97.6 F (36.4 C) (Oral)   Resp 15   Ht 5\' 8"  (1.727 m)   Wt 214 lb 15.2 oz (97.5 kg)   SpO2 97%   BMI 32.68 kg/m      Physical Exam Vitals and nursing note reviewed.  Constitutional:      General: He is not in acute distress.    Appearance: Normal appearance. He is well-developed. He is not ill-appearing.  HENT:     Head: Normocephalic and atraumatic.     Right Ear: Tympanic membrane, ear canal and external ear normal.     Left Ear: Tympanic membrane, ear canal and external ear normal.     Nose: Congestion present.     Mouth/Throat:     Mouth: Mucous membranes are moist.     Pharynx: Oropharynx is clear.  Eyes:     General: No scleral icterus.    Conjunctiva/sclera: Conjunctivae normal.  Cardiovascular:     Rate and Rhythm: Normal rate and regular rhythm.     Heart sounds: Normal heart sounds.  Pulmonary:     Effort: Pulmonary  effort is normal. No respiratory distress.     Breath sounds: Normal breath sounds.  Musculoskeletal:     Cervical back: Neck supple.  Skin:    General: Skin is warm and dry.     Capillary Refill: Capillary refill takes less than 2 seconds.  Neurological:     General: No focal deficit present.     Mental Status: He is alert. Mental status is at baseline.     Motor: No weakness.     Gait: Gait normal.  Psychiatric:        Mood and Affect: Mood normal.        Behavior: Behavior normal.      UC Treatments / Results  Labs (all labs ordered are listed, but only abnormal results are displayed) Labs Reviewed - No data to display  EKG   Radiology No results found.  Procedures Procedures (including critical care time)  Medications Ordered in UC Medications - No data to display  Initial Impression / Assessment and Plan / UC Course  I have reviewed the triage vital signs and the nursing notes.  Pertinent labs & imaging results that were available during my care of the patient were reviewed by me and considered in my medical decision making (see chart for details).   63 y/o male presents for 3 week history of nasal congestion, post nasal drainage, and sinus pressure. Denies fever, fatigue, cough, breathing difficulty.  No relief with Zyrtec, Sudafed, Mucinex D or Flonase.  Vitals are all stable. Patient is overall well appearing. On exam, he has nasal congestion. Throat is clear. Chest clear to auscultation and heart RRR.   Suspected acute bacterial sinusitis.  Treating this time with Augmentin, prednisone and Atrovent nasal spray.  Reviewed return precautions.   Final Clinical Impressions(s) / UC Diagnoses   Final diagnoses:  Acute sinusitis, recurrence not specified, unspecified location  Nasal congestion   Discharge Instructions   None  ED Prescriptions     Medication Sig Dispense Auth. Provider   amoxicillin-clavulanate (AUGMENTIN) 875-125 MG tablet Take 1  tablet by mouth every 12 (twelve) hours for 7 days. 14 tablet Eusebio Friendly B, PA-C   predniSONE (DELTASONE) 20 MG tablet Take 2 tablets (40 mg total) by mouth daily for 5 days. 10 tablet Eusebio Friendly B, PA-C   ipratropium (ATROVENT) 0.06 % nasal spray Place 2 sprays into both nostrils 4 (four) times daily. 15 mL Shirlee Latch, PA-C      PDMP not reviewed this encounter.   Shirlee Latch, PA-C 10/09/23 1455

## 2023-10-09 NOTE — ED Triage Notes (Signed)
 Patient c/o sinus drainage, nasal congestion and sinus pressure for 3 weeks.  Patient denies fevers. Patient has tried OTC medicine with no relief.

## 2023-10-27 ENCOUNTER — Encounter: Payer: Self-pay | Admitting: Dermatology

## 2023-10-27 ENCOUNTER — Ambulatory Visit: Payer: 59 | Admitting: Dermatology

## 2023-10-27 DIAGNOSIS — L111 Transient acantholytic dermatosis [Grover]: Secondary | ICD-10-CM

## 2023-10-27 DIAGNOSIS — Z85828 Personal history of other malignant neoplasm of skin: Secondary | ICD-10-CM

## 2023-10-27 DIAGNOSIS — L821 Other seborrheic keratosis: Secondary | ICD-10-CM

## 2023-10-27 DIAGNOSIS — L570 Actinic keratosis: Secondary | ICD-10-CM

## 2023-10-27 DIAGNOSIS — W908XXA Exposure to other nonionizing radiation, initial encounter: Secondary | ICD-10-CM | POA: Diagnosis not present

## 2023-10-27 DIAGNOSIS — L578 Other skin changes due to chronic exposure to nonionizing radiation: Secondary | ICD-10-CM

## 2023-10-27 DIAGNOSIS — D1801 Hemangioma of skin and subcutaneous tissue: Secondary | ICD-10-CM

## 2023-10-27 DIAGNOSIS — D492 Neoplasm of unspecified behavior of bone, soft tissue, and skin: Secondary | ICD-10-CM

## 2023-10-27 DIAGNOSIS — D485 Neoplasm of uncertain behavior of skin: Secondary | ICD-10-CM

## 2023-10-27 DIAGNOSIS — Z86007 Personal history of in-situ neoplasm of skin: Secondary | ICD-10-CM

## 2023-10-27 DIAGNOSIS — Z1283 Encounter for screening for malignant neoplasm of skin: Secondary | ICD-10-CM | POA: Diagnosis not present

## 2023-10-27 DIAGNOSIS — D229 Melanocytic nevi, unspecified: Secondary | ICD-10-CM

## 2023-10-27 DIAGNOSIS — L820 Inflamed seborrheic keratosis: Secondary | ICD-10-CM

## 2023-10-27 DIAGNOSIS — L814 Other melanin hyperpigmentation: Secondary | ICD-10-CM

## 2023-10-27 NOTE — Progress Notes (Signed)
 Follow-Up Visit   Subjective  Edward Frederick is a 63 y.o. male who presents for the following: Skin Cancer Screening and Full Body Skin Exam  The patient presents for Total-Body Skin Exam (TBSE) for skin cancer screening and mole check. The patient has spots, moles and lesions to be evaluated, some may be new or changing and the patient may have concern these could be cancer.  Pt concerned about a lesion on the L nose and L temple   The following portions of the chart were reviewed this encounter and updated as appropriate: medications, allergies, medical history  Review of Systems:  No other skin or systemic complaints except as noted in HPI or Assessment and Plan.  Objective  Well appearing patient in no apparent distress; mood and affect are within normal limits.  A full examination was performed including scalp, head, eyes, ears, nose, lips, neck, chest, axillae, abdomen, back, buttocks, bilateral upper extremities, bilateral lower extremities, hands, feet, fingers, toes, fingernails, and toenails. All findings within normal limits unless otherwise noted below.   Relevant physical exam findings are noted in the Assessment and Plan. L upper chest 0.5 cm pink papule  vertex scalp x 1, post scalp x 2, R temple x 1 (3) Erythematous thin papules/macules with gritty scale.  L temple and L cheek x 8, R cheek x 4 (12) Erythematous stuck-on, waxy papule or plaque  Assessment & Plan   SKIN CANCER SCREENING PERFORMED TODAY.  ACTINIC DAMAGE - Chronic condition, secondary to cumulative UV/sun exposure - diffuse scaly erythematous macules with underlying dyspigmentation - Recommend daily broad spectrum sunscreen SPF 30+ to sun-exposed areas, reapply every 2 hours as needed.  - Staying in the shade or wearing long sleeves, sun glasses (UVA+UVB protection) and wide brim hats (4-inch brim around the entire circumference of the hat) are also recommended for sun protection.  - Call for new or  changing lesions.  LENTIGINES, SEBORRHEIC KERATOSES, HEMANGIOMAS - Benign normal skin lesions - Benign-appearing - Call for any changes  MELANOCYTIC NEVI - Tan-brown and/or pink-flesh-colored symmetric macules and papules - Benign appearing on exam today - Observation - Call clinic for new or changing moles - Recommend daily use of broad spectrum spf 30+ sunscreen to sun-exposed areas.   HISTORY OF BASAL CELL CARCINOMA OF THE SKIN - R post auricular, L post auricular sulcus  - No evidence of recurrence today - Recommend regular full body skin exams - Recommend daily broad spectrum sunscreen SPF 30+ to sun-exposed areas, reapply every 2 hours as needed.  - Call if any new or changing lesions are noted between office visits  HISTORY OF SQUAMOUS CELL CARCINOMA IN SITU OF THE SKIN - R temple  - No evidence of recurrence today - Recommend regular full body skin exams - Recommend daily broad spectrum sunscreen SPF 30+ to sun-exposed areas, reapply every 2 hours as needed.  - Call if any new or changing lesions are noted between office visits NEOPLASM OF UNCERTAIN BEHAVIOR OF SKIN L upper chest Anatomic Pathology Report  Skin / nail biopsy Type of biopsy: tangential   Informed consent: discussed and consent obtained   Timeout: patient name, date of birth, surgical site, and procedure verified   Procedure prep:  Patient was prepped and draped in usual sterile fashion Prep type:  Isopropyl alcohol Anesthesia: the lesion was anesthetized in a standard fashion   Anesthetic:  1% lidocaine w/ epinephrine 1-100,000 buffered w/ 8.4% NaHCO3 Instrument used: flexible razor blade   Hemostasis achieved  with: pressure, aluminum chloride and electrodesiccation   Outcome: patient tolerated procedure well   Post-procedure details: sterile dressing applied and wound care instructions given   Dressing type: bandage and petrolatum   AK (ACTINIC KERATOSIS) (3) vertex scalp x 1, post scalp x 2, R  temple x 1 (3) Actinic keratoses are precancerous spots that appear secondary to cumulative UV radiation exposure/sun exposure over time. They are chronic with expected duration over 1 year. A portion of actinic keratoses will progress to squamous cell carcinoma of the skin. It is not possible to reliably predict which spots will progress to skin cancer and so treatment is recommended to prevent development of skin cancer.  Recommend daily broad spectrum sunscreen SPF 30+ to sun-exposed areas, reapply every 2 hours as needed.  Recommend staying in the shade or wearing long sleeves, sun glasses (UVA+UVB protection) and wide brim hats (4-inch brim around the entire circumference of the hat). Call for new or changing lesions.  Prior to procedure, discussed risks of blister formation, small wound, skin dyspigmentation, or rare scar following cryotherapy. Recommend Vaseline ointment to treated areas while healing.  Destruction of lesion - vertex scalp x 1, post scalp x 2, R temple x 1 (3) Complexity: simple   Destruction method: cryotherapy   Informed consent: discussed and consent obtained   Timeout:  patient name, date of birth, surgical site, and procedure verified Lesion destroyed using liquid nitrogen: Yes   Region frozen until ice ball extended beyond lesion: Yes   Outcome: patient tolerated procedure well with no complications   Post-procedure details: wound care instructions given   INFLAMED SEBORRHEIC KERATOSIS (12) L temple and L cheek x 8, R cheek x 4 (12) Symptomatic, irritating, patient would like treated.  Destruction of lesion - L temple and L cheek x 8, R cheek x 4 (12) Complexity: simple   Destruction method: cryotherapy   Informed consent: discussed and consent obtained   Timeout:  patient name, date of birth, surgical site, and procedure verified Lesion destroyed using liquid nitrogen: Yes   Region frozen until ice ball extended beyond lesion: Yes   Outcome: patient  tolerated procedure well with no complications   Post-procedure details: wound care instructions given   MULTIPLE BENIGN NEVI   LENTIGINES   ACTINIC ELASTOSIS   SEBORRHEIC KERATOSES   CHERRY ANGIOMA    Return in about 1 year (around 10/26/2024) for TBSE.  Maylene Roes, CMA, am acting as scribe for Elie Goody, MD .   Documentation: I have reviewed the above documentation for accuracy and completeness, and I agree with the above.  Elie Goody, MD

## 2023-10-27 NOTE — Patient Instructions (Addendum)

## 2023-10-29 ENCOUNTER — Other Ambulatory Visit: Payer: Self-pay

## 2023-10-29 MED ORDER — AMOXICILLIN 500 MG PO CAPS
ORAL_CAPSULE | Freq: Two times a day (BID) | ORAL | 0 refills | Status: DC
  Filled 2023-10-29: qty 22, 10d supply, fill #0

## 2023-11-03 ENCOUNTER — Other Ambulatory Visit: Payer: Self-pay

## 2023-11-03 LAB — ANATOMIC PATHOLOGY REPORT

## 2023-11-04 ENCOUNTER — Encounter: Payer: Self-pay | Admitting: Dermatology

## 2023-11-06 ENCOUNTER — Other Ambulatory Visit: Payer: Self-pay

## 2023-11-06 MED ORDER — ROSUVASTATIN CALCIUM 5 MG PO TABS
5.0000 mg | ORAL_TABLET | Freq: Every day | ORAL | 3 refills | Status: DC
  Filled 2023-11-06: qty 30, 30d supply, fill #0
  Filled 2023-11-30: qty 30, 30d supply, fill #1
  Filled 2023-12-29: qty 30, 30d supply, fill #2
  Filled 2024-02-01: qty 30, 30d supply, fill #3
  Filled 2024-03-03: qty 30, 30d supply, fill #4
  Filled 2024-03-30: qty 30, 30d supply, fill #5
  Filled 2024-05-06: qty 30, 30d supply, fill #6
  Filled 2024-06-01: qty 30, 30d supply, fill #7
  Filled 2024-07-01: qty 30, 30d supply, fill #8
  Filled 2024-08-01: qty 30, 30d supply, fill #9

## 2023-11-26 ENCOUNTER — Ambulatory Visit: Payer: 59 | Admitting: Dermatology

## 2023-11-30 ENCOUNTER — Other Ambulatory Visit: Payer: Self-pay

## 2023-11-30 MED ORDER — SILDENAFIL CITRATE 50 MG PO TABS
25.0000 mg | ORAL_TABLET | Freq: Every day | ORAL | 2 refills | Status: DC | PRN
  Filled 2023-11-30: qty 10, 5d supply, fill #0
  Filled 2024-05-12: qty 10, 5d supply, fill #1

## 2023-12-29 ENCOUNTER — Other Ambulatory Visit: Payer: Self-pay

## 2023-12-30 ENCOUNTER — Other Ambulatory Visit: Payer: Self-pay

## 2024-01-05 ENCOUNTER — Ambulatory Visit
Admission: EM | Admit: 2024-01-05 | Discharge: 2024-01-05 | Disposition: A | Discharge status: Home / Self Care | Attending: Emergency Medicine | Admitting: Emergency Medicine

## 2024-01-05 DIAGNOSIS — J0100 Acute maxillary sinusitis, unspecified: Secondary | ICD-10-CM | POA: Diagnosis not present

## 2024-01-05 DIAGNOSIS — J069 Acute upper respiratory infection, unspecified: Secondary | ICD-10-CM

## 2024-01-05 MED ORDER — PROMETHAZINE-DM 6.25-15 MG/5ML PO SYRP: 5.0000 mL | ORAL_SOLUTION | Freq: Four times a day (QID) | ORAL | 0 refills | Status: DC | PRN

## 2024-01-05 MED ORDER — BENZONATATE 100 MG PO CAPS: 100.0000 mg | ORAL_CAPSULE | Freq: Three times a day (TID) | ORAL | 0 refills | Status: DC | PRN

## 2024-01-05 MED ORDER — IPRATROPIUM BROMIDE 0.06 % NA SOLN: 2.0000 | Freq: Four times a day (QID) | NASAL | 0 refills | Status: DC

## 2024-01-05 MED ORDER — METHYLPREDNISOLONE 4 MG PO TBPK: ORAL_TABLET | ORAL | 0 refills | Status: DC

## 2024-01-05 NOTE — Discharge Instructions (Addendum)
 The Augmentin twice daily with food for 7 days for treatment of your sinusitis.  Perform sinus irrigation 2-3 times a day with a [employer] sinus rinse kit and distilled water.  Do not use tap water.  You can use plain over-the-counter Mucinex every 6 hours to break up the stickiness of the mucus so your body can clear it.  Increase your oral fluid intake to thin out your mucus so that is also able for your body to clear more easily.  Take an over-the-counter probiotic, such as Culturelle-align-activia, 1 hour after each dose of antibiotic to prevent diarrhea.  Use the Atrovent nasal spray, 2 squirts in each nostril every 6 hours, as needed for runny nose and postnasal drip.  Use the Tessalon Perles every 8 hours during the day.  Take them with a small sip of water.  They may give you some numbness to the base of your tongue or a metallic taste in your mouth, this is normal.  Use the Promethazine DM cough syrup at bedtime for cough and congestion.  It will make you drowsy so do not take it during the day.  Return for reevaluation or see your primary care provider for any new or worsening symptoms.

## 2024-01-05 NOTE — ED Provider Notes (Signed)
 [facility]    CSN: [id number] Arrival date & time: 01/05/24  1455      History   Chief Complaint Chief Complaint  Patient presents with   Sinus Problem    HPI Edward Frederick is a 63 y.o. male.   HPI  62 year old male with past medical history significant for basal cell carcinoma, squamous cell carcinoma, and essential hypertension presents for evaluation of 2 weeks worth of respiratory complaints to include sinus pressure with nasal discharge that has become bloody over the past couple days, and a nonproductive cough.  He denies fever, ear pain, sore throat, short breath, or wheezing.  Past Medical History:  Diagnosis Date   Basal cell carcinoma 07/01/2018   L post auricular sulcus   BCC (basal cell carcinoma of skin) 06/04/2022   bcc nodular patterned right postauricular treated with [facility] 06/17/2022   Squamous cell carcinoma of skin 06/04/2017   right temple SCCis    Patient Active Problem List   Diagnosis Date Noted   Essential hypertension 06/29/2019   Chronic right shoulder pain 06/29/2019    Past Surgical History:  Procedure Laterality Date   CARPAL TUNNEL RELEASE         Home Medications    Prior to Admission medications   Medication Sig Start Date End Date Taking? Authorizing Provider  losartan  (COZAAR ) 50 MG tablet Take 1 tablet (50 mg total) by mouth daily. 10/02/23  Yes   promethazine-dextromethorphan (PROMETHAZINE-DM) 6.25-15 MG/5ML syrup Take 5 mLs by mouth 4 (four) times daily as needed. 01/05/24  Yes Kent Pear, NP  rosuvastatin  (CRESTOR ) 5 MG tablet Take 1 tablet (5 mg total) by mouth daily. 11/06/23  Yes   sildenafil  (VIAGRA ) 50 MG tablet Take 0.5-2 tablets (25-100 mg total) by mouth daily as needed for erectile dysfunction 11/30/23  Yes   testosterone  (ANDROGEL ) 50 MG/5GM (1%) GEL Place 5 g onto the skin daily.   Yes [provider]  albuterol  (VENTOLIN  HFA) 108 (90 Base) MCG/ACT inhaler Inhale 2 puffs into the lungs every 4  (four) hours as needed for wheezing or shortness of breath (cough, shortness of breath or wheezing.). Patient not taking: Reported on 10/27/2023 08/27/20   McVey, Claud Crumb, PA-C  aspirin EC 81 MG tablet Take 81 mg by mouth daily. Patient not taking: Reported on 10/27/2023    [provider]  azelastine  (ASTELIN ) 0.1 % nasal spray Place 1 spray into both nostrils 2 (two) times daily. Use in each nostril as directed Patient not taking: Reported on 10/27/2023 08/27/20   McVey, Claud Crumb, PA-C  benzonatate  (TESSALON ) 100 MG capsule Take 1-2 capsules (100-200 mg total) by mouth 3 (three) times daily as needed for cough. 01/05/24   Kent Pear, NP  ciclopirox (LOPROX) 0.77 % cream Apply  a small amount   as directed QD/BID to feet, in between toes Patient not taking: Reported on 10/27/2023 09/22/19   [provider]  influenza vac split quadrivalent PF (FLUARIX QUADRIVALENT ) 0.5 ML injection Inject into the muscle. Patient not taking: Reported on 10/27/2023 06/04/22   Liane Redman, MD  ipratropium (ATROVENT ) 0.06 % nasal spray Place 2 sprays into both nostrils 4 (four) times daily. 01/05/24   Kent Pear, NP  ketoconazole (NIZORAL) 2 % cream Apply     as directed QD/BID to AA's PRN rash face Patient not taking: Reported on 10/27/2023 09/22/19   [provider]  losartan  (COZAAR ) 25 MG tablet Take by mouth. 10/09/17 10/09/18  [provider]  losartan  (  COZAAR ) 50 MG tablet Take by mouth. Patient not taking: Reported on 10/27/2023 06/29/19   [provider]  methylPREDNISolone  (MEDROL  DOSEPAK) 4 MG TBPK tablet Follow package directions. 01/05/24   Kent Pear, NP  Na Sulfate-K Sulfate-Mg Sulf 17.5-3.13-1.6 GM/177ML SOLN Take 2 Bottles (1 kit total) by mouth as directed One kit contains 2 bottles.  Take both bottles at the times instructed by your provider. Patient not taking: Reported on 10/27/2023 01/09/23     naproxen  (NAPROSYN ) 500 MG tablet Take 1  tablet (500 mg total) by mouth 2 (two) times daily with a meal. Patient not taking: Reported on 10/27/2023 08/27/20   McVey, Claud Crumb, PA-C  ondansetron  (ZOFRAN ) 4 MG tablet Take 1 tablet (4 mg total) by mouth every 8 (eight) hours as needed for nausea or vomiting. Patient not taking: Reported on 10/27/2023 08/27/20   McVey, Claud Crumb, PA-C  terbinafine  (LAMISIL ) 250 MG tablet Take 1 by mouth for 7 days in [facility] June. Repeat at mid-August, mid October, and mid-December Patient not taking: Reported on 10/27/2023 12/25/20   Moye, [place] , MD  Testosterone  1.62 % GEL Apply 2 Pump topically daily to the front and inner thighs only. Patient not taking: Reported on 10/27/2023 04/22/23     Testosterone  1.62 % GEL Apply 2 Pump topically daily. Apply to the front and inner thighs only. Patient not taking: Reported on 10/27/2023 06/23/23     Testosterone  10 MG/ACT (2%) GEL Apply 2 Pump (20 mg of testosterone  total) topically once daily Apply to the front and inner thighs only. Patient not taking: Reported on 10/27/2023 06/27/22     Testosterone  20.25 MG/ACT (1.62%) GEL APPLY ONE PUMP TO SHOULDER AND/OR UPPER ARMS ONLY ONCE DAILY AS DIRECTED 07/13/20 01/25/21    Testosterone  20.25 MG/ACT (1.62%) GEL Place 1 Pump onto the skin daily. Apply to shoulders and/or upper arms only. Patient not taking: Reported on 10/27/2023 04/06/23     triamcinolone cream (KENALOG) 0.1 % Apply  a small amount to affected area  as directed QD/BID to AA's ears, bug bites PRN. Avoid F/G/A Patient not taking: Reported on 10/27/2023 09/22/19   [provider]    Family History History reviewed. No pertinent family history.  Social History Social History   Tobacco Use   Smoking status: Never   Smokeless tobacco: Never  Vaping Use   Vaping status: Never Used  Substance Use Topics   Alcohol use: No    Alcohol/week: 0.0 standard drinks of alcohol   Drug use: No     Allergies   Patient has no known  allergies.   Review of Systems Review of Systems  Constitutional:  Negative for fever.  HENT:  Positive for congestion, rhinorrhea and sinus pain. Negative for ear pain and sore throat.   Respiratory:  Positive for cough. Negative for shortness of breath and wheezing.      Physical Exam Triage Vital Signs ED Triage Vitals  Encounter Vitals Group     BP 01/05/24 1501 (!) 145/85     Systolic BP Percentile --      Diastolic BP Percentile --      Pulse Rate 01/05/24 1501 63     Resp 01/05/24 1501 16     Temp 01/05/24 1501 97.6 F (36.4 C)     Temp Source 01/05/24 1501 Oral     SpO2 01/05/24 1501 98 %     Weight 01/05/24 1500 215 lb (97.5 kg)     Height 01/05/24 1500 5\' 8"  (1.727 m)  Head Circumference --      Peak Flow --      Pain Score 01/05/24 1503 7     Pain Loc --      Pain Education --      Exclude from Growth Chart --    No data found.  Updated Vital Signs BP (!) 145/85 (BP Location: Left Arm)   Pulse 63   Temp 97.6 F (36.4 C) (Oral)   Resp 16   Ht 5\' 8"  (1.727 m)   Wt 215 lb (97.5 kg)   SpO2 98%   BMI 32.69 kg/m   Visual Acuity Right Eye Distance:   Left Eye Distance:   Bilateral Distance:    Right Eye Near:   Left Eye Near:    Bilateral Near:     Physical Exam Vitals and nursing note reviewed.  Constitutional:      Appearance: Normal appearance. He is not ill-appearing.  HENT:     Head: Normocephalic and atraumatic.     Right Ear: Tympanic membrane, ear canal and external ear normal. There is no impacted cerumen.     Left Ear: Tympanic membrane, ear canal and external ear normal. There is no impacted cerumen.     Nose: Congestion and rhinorrhea present.     Comments: His mucosa is edematous and erythematous with yellow discharge in both nares.  Maxillary sinuses are tender to compression.    Mouth/Throat:     Mouth: Mucous membranes are moist.     Pharynx: Oropharynx is clear. No oropharyngeal exudate or posterior oropharyngeal erythema.   Cardiovascular:     Rate and Rhythm: Normal rate and regular rhythm.     Pulses: Normal pulses.     Heart sounds: Normal heart sounds. No murmur heard.    No friction rub. No gallop.  Pulmonary:     Effort: Pulmonary effort is normal.     Breath sounds: Normal breath sounds. No wheezing, rhonchi or rales.  Musculoskeletal:     Cervical back: Normal range of motion and neck supple. No tenderness.  Lymphadenopathy:     Cervical: No cervical adenopathy.  Skin:    General: Skin is warm and dry.     Capillary Refill: Capillary refill takes less than 2 seconds.     Findings: No rash.  Neurological:     General: No focal deficit present.     Mental Status: He is alert and oriented to person, place, and time.      UC Treatments / Results  Labs (all labs ordered are listed, but only abnormal results are displayed) Labs Reviewed - No data to display  EKG   Radiology No results found.  Procedures Procedures (including critical care time)  Medications Ordered in UC Medications - No data to display  Initial Impression / Assessment and Plan / UC Course  I have reviewed the triage vital signs and the nursing notes.  Pertinent labs & imaging results that were available during my care of the patient were reviewed by me and considered in my medical decision making (see chart for details).   Patient is a pleasant, nontoxic-appearing 63 year old male presenting for evaluation of 2 weeks worth of sinus complaints as outlined in the HPI above.  On exam he does have tenderness to compression of his maxillary sinuses as well as markedly edematous and erythematous nasal mucosa.  He indicates that he was experiencing bloody discharge from his nose this morning.  On exam I do not notice any blood in either nare  though he does have thick yellow discharge present.  Oropharyngeal and cardiopulmonary exams are benign.  I we will discharge patient on the diagnosis of URI with cough and congestion and  acute maxillary sinusitis.  I will start him on Augmentin  875 mg twice daily with food for 7 days along with Atrovent  nasal spray to help with the nasal congestion.  Tessalon  Perles and Promethazine DM cough syrup for cough and congestion.   Final Clinical Impressions(s) / UC Diagnoses   Final diagnoses:  URI with cough and congestion  Acute non-recurrent maxillary sinusitis     Discharge Instructions      The Augmentin  twice daily with food for 7 days for treatment of your sinusitis.  Perform sinus irrigation 2-3 times a day with a [employer] sinus rinse kit and distilled water.  Do not use tap water.  You can use plain over-the-counter Mucinex every 6 hours to break up the stickiness of the mucus so your body can clear it.  Increase your oral fluid intake to thin out your mucus so that is also able for your body to clear more easily.  Take an over-the-counter probiotic, such as Culturelle-align-activia, 1 hour after each dose of antibiotic to prevent diarrhea.  Use the Atrovent  nasal spray, 2 squirts in each nostril every 6 hours, as needed for runny nose and postnasal drip.  Use the Tessalon  Perles every 8 hours during the day.  Take them with a small sip of water.  They may give you some numbness to the base of your tongue or a metallic taste in your mouth, this is normal.  Use the Promethazine DM cough syrup at bedtime for cough and congestion.  It will make you drowsy so do not take it during the day.  Return for reevaluation or see your primary care provider for any new or worsening symptoms.    ED Prescriptions     Medication Sig Dispense Auth. Provider   benzonatate  (TESSALON ) 100 MG capsule Take 1-2 capsules (100-200 mg total) by mouth 3 (three) times daily as needed for cough. 40 capsule Kent Pear, NP   ipratropium (ATROVENT ) 0.06 % nasal spray Place 2 sprays into both nostrils 4 (four) times daily. 15 mL Kent Pear, NP   methylPREDNISolone  (MEDROL  DOSEPAK) 4 MG  TBPK tablet Follow package directions. 21 tablet Kent Pear, NP   promethazine-dextromethorphan (PROMETHAZINE-DM) 6.25-15 MG/5ML syrup Take 5 mLs by mouth 4 (four) times daily as needed. 118 mL Kent Pear, NP      PDMP not reviewed this encounter.   Kent Pear, NP 01/05/24 1524

## 2024-01-05 NOTE — ED Triage Notes (Signed)
 Pt c/o sinus pressure,congestion & cough x2 wks. Has tried OTC meds w/o relief.

## 2024-01-06 ENCOUNTER — Telehealth: Payer: Self-pay | Admitting: Family Medicine

## 2024-01-06 MED ORDER — AMOXICILLIN-POT CLAVULANATE 875-125 MG PO TABS: 1.0000 | ORAL_TABLET | Freq: Two times a day (BID) | ORAL | 0 refills | Status: DC

## 2024-01-06 NOTE — Telephone Encounter (Signed)
 Patient brought paperwork into the urgent care stating that his antibiotics were not sent.  Reviewed the chart and it appears that Augmentin  for sinusitis was not sent to the pharmacy.  Augmentin  sent to patient's preferred pharmacy.  Fidel Huddle, DO

## 2024-01-18 ENCOUNTER — Other Ambulatory Visit: Payer: Self-pay

## 2024-01-18 MED ORDER — IBUPROFEN 800 MG PO TABS
800.0000 mg | ORAL_TABLET | Freq: Three times a day (TID) | ORAL | 0 refills | Status: DC
  Filled 2024-01-18: qty 30, 10d supply, fill #0

## 2024-01-18 MED ORDER — CHLORHEXIDINE GLUCONATE 0.12 % MT SOLN
5.0000 mL | Freq: Two times a day (BID) | OROMUCOSAL | 1 refills | Status: DC
  Filled 2024-01-18: qty 473, 24d supply, fill #0

## 2024-01-18 MED ORDER — HYDROCODONE-ACETAMINOPHEN 5-325 MG PO TABS
1.0000 | ORAL_TABLET | ORAL | 0 refills | Status: DC | PRN
  Filled 2024-01-18: qty 5, 1d supply, fill #0

## 2024-01-26 ENCOUNTER — Other Ambulatory Visit: Payer: Self-pay

## 2024-01-26 MED ORDER — TESTOSTERONE 1.62 % TD GEL
2.0000 | Freq: Every day | TRANSDERMAL | 0 refills | Status: DC
  Filled 2024-01-26 – 2024-03-03 (×5): qty 75, 30d supply, fill #0

## 2024-01-27 ENCOUNTER — Other Ambulatory Visit: Payer: Self-pay

## 2024-02-01 ENCOUNTER — Other Ambulatory Visit: Payer: Self-pay

## 2024-02-04 ENCOUNTER — Other Ambulatory Visit: Payer: Self-pay

## 2024-02-09 ENCOUNTER — Other Ambulatory Visit: Payer: Self-pay

## 2024-03-03 ENCOUNTER — Other Ambulatory Visit: Payer: Self-pay

## 2024-03-30 ENCOUNTER — Other Ambulatory Visit: Payer: Self-pay

## 2024-04-25 ENCOUNTER — Other Ambulatory Visit: Payer: Self-pay

## 2024-04-26 ENCOUNTER — Other Ambulatory Visit: Payer: Self-pay

## 2024-04-26 MED ORDER — TESTOSTERONE 1.62 % TD GEL
TRANSDERMAL | 0 refills | Status: DC
  Filled 2024-04-26 – 2024-05-13 (×5): qty 75, 30d supply, fill #0

## 2024-04-28 ENCOUNTER — Other Ambulatory Visit: Payer: Self-pay

## 2024-05-02 ENCOUNTER — Other Ambulatory Visit: Payer: Self-pay

## 2024-05-06 ENCOUNTER — Other Ambulatory Visit: Payer: Self-pay

## 2024-05-12 ENCOUNTER — Other Ambulatory Visit: Payer: Self-pay

## 2024-05-13 ENCOUNTER — Other Ambulatory Visit: Payer: Self-pay

## 2024-05-13 MED ORDER — METFORMIN HCL 500 MG PO TABS
500.0000 mg | ORAL_TABLET | Freq: Two times a day (BID) | ORAL | 3 refills | Status: DC
  Filled 2024-05-13: qty 180, 90d supply, fill #0
  Filled 2024-08-08: qty 180, 90d supply, fill #1

## 2024-05-18 ENCOUNTER — Other Ambulatory Visit: Payer: Self-pay

## 2024-05-18 MED ORDER — BLOOD GLUCOSE MONITOR SYSTEM W/DEVICE KIT
PACK | 0 refills | Status: DC
  Filled 2024-05-18: qty 1, 30d supply, fill #0

## 2024-05-18 MED ORDER — PRECISION QID TEST VI STRP
ORAL_STRIP | 11 refills | Status: DC
  Filled 2024-05-18: qty 100, 90d supply, fill #0

## 2024-05-18 MED ORDER — LANCETS 33G MISC
11 refills | Status: DC
  Filled 2024-05-18: qty 100, 100d supply, fill #0

## 2024-06-01 ENCOUNTER — Other Ambulatory Visit: Payer: Self-pay

## 2024-06-20 ENCOUNTER — Other Ambulatory Visit: Payer: Self-pay

## 2024-06-20 MED ORDER — FLUZONE HIGH-DOSE 0.5 ML IM SUSY
0.5000 mL | PREFILLED_SYRINGE | Freq: Once | INTRAMUSCULAR | 0 refills | Status: AC
  Filled 2024-06-20: qty 0.5, 1d supply, fill #0

## 2024-07-01 ENCOUNTER — Other Ambulatory Visit: Payer: Self-pay

## 2024-07-14 ENCOUNTER — Other Ambulatory Visit: Payer: Self-pay

## 2024-07-14 MED ORDER — METOPROLOL TARTRATE 25 MG PO TABS
25.0000 mg | ORAL_TABLET | Freq: Once | ORAL | 0 refills | Status: DC
  Filled 2024-07-14: qty 1, 1d supply, fill #0

## 2024-07-15 ENCOUNTER — Other Ambulatory Visit: Payer: Self-pay | Admitting: Internal Medicine

## 2024-07-15 DIAGNOSIS — R9431 Abnormal electrocardiogram [ECG] [EKG]: Secondary | ICD-10-CM

## 2024-07-15 DIAGNOSIS — R0789 Other chest pain: Secondary | ICD-10-CM

## 2024-07-15 DIAGNOSIS — R0602 Shortness of breath: Secondary | ICD-10-CM

## 2024-08-01 ENCOUNTER — Other Ambulatory Visit: Payer: Self-pay

## 2024-08-03 ENCOUNTER — Telehealth (HOSPITAL_COMMUNITY): Payer: Self-pay | Admitting: *Deleted

## 2024-08-03 NOTE — Telephone Encounter (Signed)
 Patient calling about his upcoming cardiac imaging study; pt verbalizes understanding of appt date/time, parking situation and where to check in, pre-test NPO status and medications ordered, and verified current allergies; name and call back number provided for further questions should they arise  Chantal Requena RN Navigator Cardiac Imaging [facility] [phone] office [phone] cell  Patient to take 25mg  metoprolol  tartrate two hours prior to his cardiac CT scan.

## 2024-08-08 ENCOUNTER — Other Ambulatory Visit: Payer: Self-pay | Admitting: Cardiovascular Disease

## 2024-08-08 ENCOUNTER — Other Ambulatory Visit: Payer: Self-pay

## 2024-08-08 ENCOUNTER — Ambulatory Visit
Admission: RE | Admit: 2024-08-08 | Discharge: 2024-08-08 | Disposition: A | Discharge status: Home / Self Care | Source: Ambulatory Visit | Attending: Internal Medicine | Admitting: Internal Medicine

## 2024-08-08 DIAGNOSIS — R0789 Other chest pain: Secondary | ICD-10-CM | POA: Insufficient documentation

## 2024-08-08 DIAGNOSIS — R9431 Abnormal electrocardiogram [ECG] [EKG]: Secondary | ICD-10-CM | POA: Insufficient documentation

## 2024-08-08 DIAGNOSIS — R0602 Shortness of breath: Secondary | ICD-10-CM | POA: Diagnosis present

## 2024-08-08 DIAGNOSIS — R931 Abnormal findings on diagnostic imaging of heart and coronary circulation: Secondary | ICD-10-CM

## 2024-08-08 MED ORDER — IOHEXOL 350 MG/ML SOLN
100.0000 mL | Freq: Once | INTRAVENOUS | Status: AC | PRN
  Administered 2024-08-08: 100 mL via INTRAVENOUS

## 2024-08-08 MED ORDER — DILTIAZEM HCL 25 MG/5ML IV SOLN
10.0000 mg | INTRAVENOUS | Status: DC | PRN
  Filled 2024-08-08: qty 5

## 2024-08-08 MED ORDER — NITROGLYCERIN 0.4 MG SL SUBL
0.8000 mg | SUBLINGUAL_TABLET | Freq: Once | SUBLINGUAL | Status: AC
  Administered 2024-08-08: 0.8 mg via SUBLINGUAL
  Filled 2024-08-08: qty 25

## 2024-08-08 MED ORDER — METOPROLOL TARTRATE 5 MG/5ML IV SOLN
10.0000 mg | Freq: Once | INTRAVENOUS | Status: DC | PRN
  Filled 2024-08-08: qty 10

## 2024-08-08 NOTE — Progress Notes (Signed)
 Patient tolerated procedure well. Ambulate w/o difficulty. Denies any lightheadedness or being dizzy. Pt denies any pain at this time. Sitting in chair. Pt is encouraged to drink additional water throughout the day and reason explained to patient. Patient verbalized understanding and all questions answered. ABC intact. No further needs at this time. Discharge from procedure area w/o issues.

## 2024-08-10 LAB — POCT I-STAT CREATININE: Creatinine, Ser: 1.2 mg/dL (ref 0.61–1.24)

## 2024-08-12 ENCOUNTER — Encounter
Admission: RE | Disposition: A | Discharge status: Home / Self Care | Payer: Self-pay | Source: Home / Self Care | Attending: Internal Medicine

## 2024-08-12 ENCOUNTER — Encounter: Payer: Self-pay | Admitting: Internal Medicine

## 2024-08-12 ENCOUNTER — Ambulatory Visit
Admission: RE | Admit: 2024-08-12 | Discharge: 2024-08-12 | Disposition: A | Discharge status: Home / Self Care | Attending: Internal Medicine | Admitting: Internal Medicine

## 2024-08-12 ENCOUNTER — Other Ambulatory Visit: Payer: Self-pay

## 2024-08-12 DIAGNOSIS — Z6832 Body mass index (BMI) 32.0-32.9, adult: Secondary | ICD-10-CM | POA: Insufficient documentation

## 2024-08-12 DIAGNOSIS — I25118 Atherosclerotic heart disease of native coronary artery with other forms of angina pectoris: Secondary | ICD-10-CM | POA: Diagnosis not present

## 2024-08-12 DIAGNOSIS — I10 Essential (primary) hypertension: Secondary | ICD-10-CM | POA: Insufficient documentation

## 2024-08-12 DIAGNOSIS — Z8249 Family history of ischemic heart disease and other diseases of the circulatory system: Secondary | ICD-10-CM | POA: Insufficient documentation

## 2024-08-12 DIAGNOSIS — Z7984 Long term (current) use of oral hypoglycemic drugs: Secondary | ICD-10-CM | POA: Insufficient documentation

## 2024-08-12 DIAGNOSIS — R0602 Shortness of breath: Secondary | ICD-10-CM | POA: Diagnosis present

## 2024-08-12 DIAGNOSIS — E669 Obesity, unspecified: Secondary | ICD-10-CM | POA: Diagnosis not present

## 2024-08-12 DIAGNOSIS — E119 Type 2 diabetes mellitus without complications: Secondary | ICD-10-CM | POA: Diagnosis not present

## 2024-08-12 HISTORY — PX: LEFT HEART CATH AND CORONARY ANGIOGRAPHY: CATH118249

## 2024-08-12 LAB — GLUCOSE, CAPILLARY
Glucose-Capillary: 123 mg/dL — ABNORMAL HIGH (ref 70–99)
Glucose-Capillary: 120 mg/dL — ABNORMAL HIGH (ref 70–99)

## 2024-08-12 SURGERY — LEFT HEART CATH AND CORONARY ANGIOGRAPHY
Anesthesia: Moderate Sedation | Laterality: Left

## 2024-08-12 MED ORDER — SODIUM CHLORIDE 0.9 % IV SOLN: 250.0000 mL | INTRAVENOUS | Status: DC | PRN

## 2024-08-12 MED ORDER — FREE WATER: 500.0000 mL | Freq: Once | Status: DC

## 2024-08-12 MED ORDER — FENTANYL CITRATE (PF) 100 MCG/2ML IJ SOLN
INTRAMUSCULAR | Status: AC
  Filled 2024-08-12: qty 2

## 2024-08-12 MED ORDER — HEPARIN SODIUM (PORCINE) 1000 UNIT/ML IJ SOLN
INTRAMUSCULAR | Status: DC | PRN
  Administered 2024-08-12: 4000 [IU] via INTRAVENOUS

## 2024-08-12 MED ORDER — HEPARIN (PORCINE) IN NACL 1000-0.9 UT/500ML-% IV SOLN
INTRAVENOUS | Status: AC
  Filled 2024-08-12: qty 1000

## 2024-08-12 MED ORDER — IOHEXOL 300 MG/ML  SOLN
INTRAMUSCULAR | Status: DC | PRN
  Administered 2024-08-12: 110 mL

## 2024-08-12 MED ORDER — HEPARIN (PORCINE) IN NACL 2000-0.9 UNIT/L-% IV SOLN
INTRAVENOUS | Status: DC | PRN
  Administered 2024-08-12: 1000 mL

## 2024-08-12 MED ORDER — VERAPAMIL HCL 2.5 MG/ML IV SOLN
INTRAVENOUS | Status: DC | PRN
  Administered 2024-08-12: 2.5 mg via INTRAVENOUS

## 2024-08-12 MED ORDER — MIDAZOLAM HCL (PF) 2 MG/2ML IJ SOLN
INTRAMUSCULAR | Status: DC | PRN
  Administered 2024-08-12 (×2): 1 mg via INTRAVENOUS

## 2024-08-12 MED ORDER — SODIUM CHLORIDE 0.9% FLUSH: 3.0000 mL | Freq: Two times a day (BID) | INTRAVENOUS | Status: DC

## 2024-08-12 MED ORDER — LIDOCAINE HCL 1 % IJ SOLN
INTRAMUSCULAR | Status: AC
  Filled 2024-08-12: qty 20

## 2024-08-12 MED ORDER — HEPARIN SODIUM (PORCINE) 1000 UNIT/ML IJ SOLN
INTRAMUSCULAR | Status: AC
  Filled 2024-08-12: qty 10

## 2024-08-12 MED ORDER — MIDAZOLAM HCL 2 MG/2ML IJ SOLN
INTRAMUSCULAR | Status: AC
  Filled 2024-08-12: qty 2

## 2024-08-12 MED ORDER — FREE WATER
500.0000 mL | Freq: Once | Status: AC
  Administered 2024-08-12: 500 mL via ORAL

## 2024-08-12 MED ORDER — LIDOCAINE HCL (PF) 1 % IJ SOLN
INTRAMUSCULAR | Status: DC | PRN
  Administered 2024-08-12: 5 mL

## 2024-08-12 MED ORDER — VERAPAMIL HCL 2.5 MG/ML IV SOLN
INTRAVENOUS | Status: AC
  Filled 2024-08-12: qty 2

## 2024-08-12 MED ORDER — SODIUM CHLORIDE 0.9% FLUSH: 3.0000 mL | INTRAVENOUS | Status: DC | PRN

## 2024-08-12 MED ORDER — SODIUM CHLORIDE 0.9 % IV SOLN
250.0000 mL | INTRAVENOUS | Status: DC | PRN
  Administered 2024-08-12: 250 mL via INTRAVENOUS

## 2024-08-12 MED ORDER — ASPIRIN 81 MG PO CHEW: 81.0000 mg | CHEWABLE_TABLET | ORAL | Status: DC

## 2024-08-12 MED ORDER — FENTANYL CITRATE (PF) 100 MCG/2ML IJ SOLN
INTRAMUSCULAR | Status: DC | PRN
  Administered 2024-08-12: 50 ug via INTRAVENOUS
  Administered 2024-08-12: 25 ug via INTRAVENOUS

## 2024-08-12 SURGICAL SUPPLY — 10 items
CATH 5FR JL3.5 JR4 ANG PIG MP (CATHETERS) IMPLANT
CATH INFINITI 5 FR MPA2 (CATHETERS) IMPLANT
DEVICE RAD TR BAND REGULAR (VASCULAR PRODUCTS) IMPLANT
DRAPE BRACHIAL (DRAPES) IMPLANT
GLIDESHEATH SLEND SS 6F .021 (SHEATH) IMPLANT
GUIDEWIRE INQWIRE 1.5J.035X260 (WIRE) IMPLANT
PACK CARDIAC CATH (CUSTOM PROCEDURE TRAY) ×1 IMPLANT
SET ATX-X65L (MISCELLANEOUS) IMPLANT
STATION PROTECTION PRESSURIZED (MISCELLANEOUS) IMPLANT
WIRE GUIDERIGHT .035X150 (WIRE) IMPLANT

## 2024-08-17 ENCOUNTER — Other Ambulatory Visit: Payer: Self-pay

## 2024-08-17 ENCOUNTER — Encounter: Payer: Self-pay | Admitting: Internal Medicine

## 2024-08-17 LAB — CARDIAC CATHETERIZATION: Cath EF Quantitative: 55 %

## 2024-08-17 MED ORDER — ROSUVASTATIN CALCIUM 10 MG PO TABS
10.0000 mg | ORAL_TABLET | Freq: Every day | ORAL | 3 refills | Status: DC
  Filled 2024-08-17 – 2024-08-18 (×2): qty 90, 90d supply, fill #0

## 2024-08-18 ENCOUNTER — Other Ambulatory Visit: Payer: Self-pay

## 2024-09-02 ENCOUNTER — Other Ambulatory Visit: Payer: Self-pay

## 2024-09-02 MED ORDER — ONETOUCH DELICA PLUS LANCET33G MISC
11 refills | Status: AC
  Filled 2024-09-02: qty 300, 90d supply, fill #0

## 2024-09-02 MED ORDER — BLOOD GLUCOSE TEST VI STRP
ORAL_STRIP | 11 refills | Status: AC
  Filled 2024-09-02: qty 300, 90d supply, fill #0

## 2024-09-05 ENCOUNTER — Other Ambulatory Visit: Payer: Self-pay

## 2024-09-06 ENCOUNTER — Other Ambulatory Visit: Payer: Self-pay

## 2024-09-06 MED ORDER — OXYCODONE HCL 5 MG PO TABS
5.0000 mg | ORAL_TABLET | Freq: Three times a day (TID) | ORAL | 0 refills | Status: AC | PRN
  Filled 2024-09-06: qty 15, 5d supply, fill #0

## 2024-09-06 MED ORDER — METHOCARBAMOL 500 MG PO TABS
500.0000 mg | ORAL_TABLET | Freq: Four times a day (QID) | ORAL | 0 refills | Status: AC
  Filled 2024-09-06: qty 28, 7d supply, fill #0

## 2024-09-08 ENCOUNTER — Other Ambulatory Visit: Payer: Self-pay

## 2024-09-08 MED ORDER — METOPROLOL SUCCINATE ER 25 MG PO TB24
25.0000 mg | ORAL_TABLET | Freq: Every day | ORAL | 1 refills | Status: AC
  Filled 2024-09-08: qty 90, 90d supply, fill #0

## 2024-10-31 ENCOUNTER — Ambulatory Visit: Admitting: Dermatology
